# Patient Record
Sex: Female | Born: 2005 | Race: Black or African American | Hispanic: No | Marital: Single | Smoking: Never smoker
Health system: Southern US, Community
[De-identification: ages and names within clinical notes are randomized; demographics above are authoritative.]

## PROBLEM LIST (undated history)

## (undated) DIAGNOSIS — R634 Abnormal weight loss: Secondary | ICD-10-CM

## (undated) DIAGNOSIS — F988 Other specified behavioral and emotional disorders with onset usually occurring in childhood and adolescence: Secondary | ICD-10-CM

## (undated) DIAGNOSIS — Z00129 Encounter for routine child health examination without abnormal findings: Secondary | ICD-10-CM

## (undated) HISTORY — DX: Other specified behavioral and emotional disorders with onset usually occurring in childhood and adolescence: F98.8

## (undated) HISTORY — DX: Abnormal weight loss: R63.4

## (undated) HISTORY — DX: Encounter for routine child health examination without abnormal findings: Z00.129

---
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
## 2023-03-15 ENCOUNTER — Ambulatory Visit (INDEPENDENT_AMBULATORY_CARE_PROVIDER_SITE_OTHER): Payer: BC Managed Care – PPO | Admitting: Family Medicine

## 2023-03-15 VITALS — BP 104/64 | HR 68 | Temp 98.0°F | Resp 16 | Ht 67.0 in | Wt 104.0 lb

## 2023-03-15 DIAGNOSIS — F419 Anxiety disorder, unspecified: Secondary | ICD-10-CM | POA: Diagnosis not present

## 2023-03-15 DIAGNOSIS — J309 Allergic rhinitis, unspecified: Secondary | ICD-10-CM | POA: Diagnosis not present

## 2023-03-15 DIAGNOSIS — R202 Paresthesia of skin: Secondary | ICD-10-CM

## 2023-03-15 DIAGNOSIS — F988 Other specified behavioral and emotional disorders with onset usually occurring in childhood and adolescence: Secondary | ICD-10-CM

## 2023-03-15 DIAGNOSIS — F330 Major depressive disorder, recurrent, mild: Secondary | ICD-10-CM | POA: Diagnosis not present

## 2023-03-15 NOTE — Assessment & Plan Note (Signed)
Flonase prn 

## 2023-03-15 NOTE — Patient Instructions (Signed)
[email or web address]   Insomnia Insomnia is a sleep disorder that makes it difficult to fall asleep or stay asleep. Insomnia can cause fatigue, low energy, difficulty concentrating, mood swings, and poor performance at work or school. There are three different ways to classify insomnia: Difficulty falling asleep. Difficulty staying asleep. Waking up too early in the morning. Any type of insomnia can be long-term (chronic) or short-term (acute). Both are common. Short-term insomnia usually lasts for 3 months or less. Chronic insomnia occurs at least three times a week for longer than 3 months. What are the causes? Insomnia may be caused by another condition, situation, or substance, such as: Having certain mental health conditions, such as anxiety and depression. Using caffeine, alcohol, tobacco, or drugs. Having gastrointestinal conditions, such as gastroesophageal reflux disease (GERD). Having certain medical conditions. These include: Asthma. Alzheimer's disease. Stroke. Chronic pain. An overactive thyroid gland (hyperthyroidism). Other sleep disorders, such as restless legs syndrome and sleep apnea. Menopause. Sometimes, the cause of insomnia may not be known. What increases the risk? Risk factors for insomnia include: Gender. Females are affected more often than males. Age. Insomnia is more common as people get older. Stress and certain medical and mental health conditions. Lack of exercise. Having an irregular work schedule. This may include working night shifts and traveling between different time zones. What are the signs or symptoms? If you have insomnia, the main symptom is having trouble falling asleep or having trouble staying asleep. This may lead to other symptoms, such as: Feeling tired or having low energy. Feeling nervous about going to sleep. Not feeling rested in the morning. Having trouble concentrating. Feeling irritable, anxious, or depressed. How is this  diagnosed? This condition may be diagnosed based on: Your symptoms and medical history. Your health care provider may ask about: Your sleep habits. Any medical conditions you have. Your mental health. A physical exam. How is this treated? Treatment for insomnia depends on the cause. Treatment may focus on treating an underlying condition that is causing the insomnia. Treatment may also include: Medicines to help you sleep. Counseling or therapy. Lifestyle adjustments to help you sleep better. Follow these instructions at home: Eating and drinking  Limit or avoid alcohol, caffeinated beverages, and products that contain nicotine and tobacco, especially close to bedtime. These can disrupt your sleep. Do not eat a large meal or eat spicy foods right before bedtime. This can lead to digestive discomfort that can make it hard for you to sleep. Sleep habits  Keep a sleep diary to help you and your health care provider figure out what could be causing your insomnia. Write down: When you sleep. When you wake up during the night. How well you sleep and how rested you feel the next day. Any side effects of medicines you are taking. What you eat and drink. Make your bedroom a dark, comfortable place where it is easy to fall asleep. Put up shades or blackout curtains to block light from outside. Use a white noise machine to block noise. Keep the temperature cool. Limit screen use before bedtime. This includes: Not watching TV. Not using your smartphone, tablet, or computer. Stick to a routine that includes going to bed and waking up at the same times every day and night. This can help you fall asleep faster. Consider making a quiet activity, such as reading, part of your nighttime routine. Try to avoid taking naps during the day so that you sleep better at night. Get out of bed if you are  still awake after 15 minutes of trying to sleep. Keep the lights down, but try reading or doing a quiet  activity. When you feel sleepy, go back to bed. General instructions Take over-the-counter and prescription medicines only as told by your health care provider. Exercise regularly as told by your health care provider. However, avoid exercising in the hours right before bedtime. Use relaxation techniques to manage stress. Ask your health care provider to suggest some techniques that may work well for you. These may include: Breathing exercises. Routines to release muscle tension. Visualizing peaceful scenes. Make sure that you drive carefully. Do not drive if you feel very sleepy. Keep all follow-up visits. This is important. Contact a health care provider if: You are tired throughout the day. You have trouble in your daily routine due to sleepiness. You continue to have sleep problems, or your sleep problems get worse. Get help right away if: You have thoughts about hurting yourself or someone else. Get help right away if you feel like you may hurt yourself or others, or have thoughts about taking your own life. Go to your nearest emergency room or: Call 911. Call the National Suicide Prevention Lifeline at 864-132-5576 or 988. This is open 24 hours a day. Text the Crisis Text Line at 484-724-7431. Summary Insomnia is a sleep disorder that makes it difficult to fall asleep or stay asleep. Insomnia can be long-term (chronic) or short-term (acute). Treatment for insomnia depends on the cause. Treatment may focus on treating an underlying condition that is causing the insomnia. Keep a sleep diary to help you and your health care provider figure out what could be causing your insomnia. This information is not intended to replace advice given to you by your health care provider. Make sure you discuss any questions you have with your health care provider. Document Revised: 10/03/2021 Document Reviewed: 10/03/2021 Elsevier Patient Education  2023 [employer].

## 2023-03-15 NOTE — Assessment & Plan Note (Addendum)
Has finished school and is not working yet so has not been taking any Adderal. UDS is obtained in case she starts working and needs a new prescription.

## 2023-03-15 NOTE — Progress Notes (Signed)
Subjective:   By signing my name below, I, Vickey Sages, attest that this documentation has been prepared under the direction and in the presence of Bradd Canary, MD., 03/15/2023.   Patient ID: Tiffany Graves, female    DOB: 27-Oct-2006, 17 y.o.   MRN: [id number]  Chief Complaint  Patient presents with   Follow-up    Follow up   HPI Patient is in today for an office visit and is accompanied by her mother.  ADD (attention deficit disorder) Patient recently graduated high school so she has only been taking Adderall 20 mg as needed to manage ADD. This medication has been tolerable and she would like to continue taking it when she starts working. Her controlled substance contract has been renewed today.  Anxiety Patient's mother states that her daughter would benefit from counseling due to her anxiety, sedentary lifestyle, and timid personality. She recently graduated high school and her mother believes counseling could help decide on her next steps in life. She is currently in the process of finding a counselor/therapist with [facility].  Tingling of Upper Lip Patient reports that her top lip has been been tingling for the past several days. She suspects this could be due to coloring pencils she has recently been using. She does not use cosmetics and denies any changes to her diet. She further denies closing of the throat/CP/palpitations/SOB/HA/fever/chills/GI or GU symptoms.  Past Medical History:  Diagnosis Date   ADD (attention deficit disorder) 10/01/2012   Loss of weight 01/25/2016   WCC (well child check) 10/01/2012   No past surgical history on file.  Family History  Problem Relation Age of Onset   Hypertension Mother    Hyperlipidemia Mother    Diabetes Father        type 2   Cancer Paternal Grandmother        breast- remission   Diabetes Paternal Grandmother        type 2   Stroke Paternal Grandfather     Social History   Socioeconomic History    Marital status: Single    Spouse name: Not on file   Number of children: Not on file   Years of education: Not on file   Highest education level: Not on file  Occupational History   Not on file  Tobacco Use   Smoking status: Never   Smokeless tobacco: Never  Substance and Sexual Activity   Alcohol use: No   Drug use: No   Sexual activity: Never  Other Topics Concern   Not on file  Social History Narrative   ** Merged History Encounter **       12 Grade at [employer]   Social Determinants of Health   Financial Resource Strain: Not on file  Food Insecurity: Not on file  Transportation Needs: Not on file  Physical Activity: Not on file  Stress: Not on file  Social Connections: Not on file  Intimate Partner Violence: Not on file    Outpatient Medications Prior to Visit  Medication Sig Dispense Refill   amphetamine-dextroamphetamine (ADDERALL XR) 20 MG 24 hr capsule Take 1 capsule (20 mg total) by mouth daily. November 2023 30 capsule 0   amphetamine-dextroamphetamine (ADDERALL XR) 20 MG 24 hr capsule Take 1 capsule (20 mg total) by mouth every morning. October 2023 30 capsule 0   fluticasone (FLONASE) 50 MCG/ACT nasal spray Place 2 sprays into both nostrils daily. 16 g 6   COVID-19 mRNA bivalent vaccine, Pfizer, (  PFIZER COVID-19 VAC BIVALENT) injection Inject into the muscle. 0.3 mL 0   No facility-administered medications prior to visit.    No Known Allergies  Review of Systems  Constitutional:  Negative for chills and fever.  Respiratory:  Negative for shortness of breath.   Cardiovascular:  Negative for chest pain and palpitations.  Gastrointestinal:  Negative for abdominal pain, blood in stool, constipation, diarrhea, nausea and vomiting.  Genitourinary:  Negative for dysuria, frequency, hematuria and urgency.  Skin:           Neurological:  Negative for headaches.       Objective:    Physical Exam Constitutional:      General: She is not in  acute distress.    Appearance: Normal appearance. She is not ill-appearing.  HENT:     Head: Normocephalic and atraumatic.     Right Ear: External ear normal.     Left Ear: External ear normal.     Nose: Nose normal.     Mouth/Throat:     Mouth: Mucous membranes are moist.     Pharynx: Oropharynx is clear.  Eyes:     General:        Right eye: No discharge.        Left eye: No discharge.     Extraocular Movements: Extraocular movements intact.     Conjunctiva/sclera: Conjunctivae normal.     Pupils: Pupils are equal, round, and reactive to light.  Cardiovascular:     Rate and Rhythm: Normal rate and regular rhythm.     Pulses: Normal pulses.     Heart sounds: Normal heart sounds. No murmur heard.    No gallop.  Pulmonary:     Effort: Pulmonary effort is normal. No respiratory distress.     Breath sounds: Normal breath sounds. No wheezing or rales.  Abdominal:     General: Bowel sounds are normal.     Palpations: Abdomen is soft.     Tenderness: There is no abdominal tenderness. There is no guarding.  Musculoskeletal:        General: Normal range of motion.     Cervical back: Normal range of motion.     Right lower leg: No edema.     Left lower leg: No edema.  Skin:    General: Skin is warm and dry.  Neurological:     Mental Status: She is alert and oriented to person, place, and time.  Psychiatric:        Mood and Affect: Mood normal.        Behavior: Behavior normal.        Judgment: Judgment normal.     BP 104/64 (BP Location: Right Arm, Patient Position: Sitting, Cuff Size: Normal)   Pulse 68   Temp 98 F (36.7 C) (Oral)   Resp 16   Ht 5\' 7"  (1.702 m)   Wt 104 lb (47.2 kg)   SpO2 98%   BMI 16.29 kg/m  Wt Readings from Last 3 Encounters:  03/15/23 104 lb (47.2 kg) (8 %, Z= -1.42)*  10/11/22 102 lb 4 oz (46.4 kg) (6 %, Z= -1.52)*  08/22/22 103 lb 6.4 oz (46.9 kg) (8 %, Z= -1.41)*   * Growth percentiles are based on CDC (Girls, 2-20 Years) data.     Diabetic Foot Exam - Simple   No data filed    Lab Results  Component Value Date   TSH 1.21 01/27/2022    Lab Results  Component Value Date  TSH 1.21 01/27/2022   No results found for: "WBC", "HGB", "HCT", "MCV", "PLT" No results found for: "NA", "K", "CHLORIDE", "CO2", "GLUCOSE", "BUN", "CREATININE", "BILITOT", "ALKPHOS", "AST", "ALT", "PROT", "ALBUMIN", "CALCIUM", "ANIONGAP", "EGFR", "GFR" No results found for: "CHOL" No results found for: "HDL" No results found for: "LDLCALC" No results found for: "TRIG" No results found for: "CHOLHDL" No results found for: "HGBA1C"     Assessment & Plan:  ADD (attention deficit disorder): This is well-controlled with Adderall 20 mg. There were no medication adjustments today and patient's controlled substance contract was renewed today.  Anxiety/Counseling Encouraged patient to follow with counseling to manage anxiety and choose next steps in life. Recommended [email or web address].[email or web address]/. Patient is following with [facility] to find a counselor/therapist.  Healthy Lifestyle: Encouraged 6-8 hours of sleep, heart healthy diet with protein, 60-80 oz of non-alcohol/non-caffeinated fluids, and 4000-8000 steps daily.  Tingling of Upper Lip: Recommended Benadryl, Zyrtec, and allergy testing if tingling persists.  Problem List Items Addressed This Visit     Allergic rhinitis    Flonase prn      ADD (attention deficit disorder) - Primary    Has finished school and is not working yet so has not been taking any Adderal. UDS is obtained in case she starts working and needs a new prescription.       Anxiety    Discussed counseling and they continue to pursue her finding a good fit. Consider medications after that if needed.       Major depressive disorder, recurrent episode, mild (HCC)    Declines medications but is going to pursue counseling      Tingling of face    One episode no other associated symptoms and just upper  lip. No new foods or products. She will let us know if happens again and consider antihistamines if recurs      No orders of the defined types were placed in this encounter.  I, Danise Edge, MD, personally preformed the services described in this documentation.  All medical record entries made by the scribe were at my direction and in my presence.  I have reviewed the chart and discharge instructions (if applicable) and agree that the record reflects my personal performance and is accurate and complete. 03/15/2023  I,Mohammed Iqbal,acting as a scribe for Danise Edge, MD.,have documented all relevant documentation on the behalf of Danise Edge, MD,as directed by  Danise Edge, MD while in the presence of Danise Edge, MD.  Danise Edge, MD

## 2023-03-17 ENCOUNTER — Encounter: Payer: Self-pay | Admitting: Family Medicine

## 2023-03-17 DIAGNOSIS — R202 Paresthesia of skin: Secondary | ICD-10-CM | POA: Insufficient documentation

## 2023-03-17 NOTE — Assessment & Plan Note (Signed)
Discussed counseling and they continue to pursue her finding a good fit. Consider medications after that if needed.

## 2023-03-17 NOTE — Assessment & Plan Note (Signed)
One episode no other associated symptoms and just upper lip. No new foods or products. She will let us know if happens again and consider antihistamines if recurs

## 2023-03-17 NOTE — Assessment & Plan Note (Addendum)
Declines medications but is going to pursue counseling

## 2023-05-21 ENCOUNTER — Ambulatory Visit: Payer: BC Managed Care – PPO | Admitting: Behavioral Health

## 2023-05-21 ENCOUNTER — Encounter: Payer: Self-pay | Admitting: Behavioral Health

## 2023-05-21 DIAGNOSIS — F331 Major depressive disorder, recurrent, moderate: Secondary | ICD-10-CM | POA: Diagnosis not present

## 2023-05-21 DIAGNOSIS — F900 Attention-deficit hyperactivity disorder, predominantly inattentive type: Secondary | ICD-10-CM

## 2023-05-21 DIAGNOSIS — F411 Generalized anxiety disorder: Secondary | ICD-10-CM | POA: Diagnosis not present
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
## 2023-05-21 NOTE — Progress Notes (Signed)
                Craig M Peters, LCMHC 

## 2023-06-05 ENCOUNTER — Encounter: Payer: Self-pay | Admitting: Behavioral Health

## 2023-06-05 ENCOUNTER — Ambulatory Visit (INDEPENDENT_AMBULATORY_CARE_PROVIDER_SITE_OTHER): Payer: BC Managed Care – PPO | Admitting: Behavioral Health

## 2023-06-05 DIAGNOSIS — F411 Generalized anxiety disorder: Secondary | ICD-10-CM | POA: Diagnosis not present

## 2023-06-05 DIAGNOSIS — F330 Major depressive disorder, recurrent, mild: Secondary | ICD-10-CM
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
## 2023-06-05 NOTE — Progress Notes (Signed)
                Craig M Peters, LCMHC 

## 2023-06-19 ENCOUNTER — Ambulatory Visit (INDEPENDENT_AMBULATORY_CARE_PROVIDER_SITE_OTHER): Payer: BC Managed Care – PPO | Admitting: Behavioral Health

## 2023-06-19 ENCOUNTER — Encounter: Payer: Self-pay | Admitting: Behavioral Health

## 2023-06-19 DIAGNOSIS — F411 Generalized anxiety disorder: Secondary | ICD-10-CM | POA: Diagnosis not present

## 2023-06-19 NOTE — Progress Notes (Signed)
[facility] Counselor/Therapist Progress Note  Patient ID: Tiffany Graves, MRN: [id number],    Date: 06/19/2023  Time Spent: 52 minutes, 3:08 PM to 4:00 PM spent in person with the patient.  Treatment Type: Individual Therapy  Reported Symptoms: Anxiety, depression  Mental Status Exam: Appearance:  Well Groomed     Behavior: Appropriate  Motor: Normal  Speech/Language:  Clear and Coherent  Affect: Appropriate  Mood: normal  Thought process: normal  Thought content:   WNL  Sensory/Perceptual disturbances:   WNL  Orientation: oriented to person, place, time/date, situation, day of week, and month of year  Attention: Good  Concentration: Good  Memory: WNL  Fund of knowledge:  Good  Insight:   Good  Judgment:  Good  Impulse Control: Good   Risk Assessment: Danger to Self:  No Self-injurious Behavior: No Danger to Others: No Duty to Warn:no Physical Aggression / Violence:No  Access to Firearms a concern: No  Gang Involvement:No   Subjective: I met with the patient individually  in session today.  She reports there have been no situations for which she has had to use any anxiety coping skills and is thankful for that.  She and her mother are working with a cousin to see what services might be available to help her better understand how to conduct a job [employer] fast stream[employer].  She says she was never taught that in school.  I recommended reaching out to vocational rehabilitation and [employer] so that she could feel better about applying for jobs, interviews etc.  She is not sure how long their insurance will cover therapy so I gave her the main office number to reach out to them to make sure she is covered.  She is fairly sure her father has insurance until November of this year.  We do have more appointments scheduled but she knows she can cancel them if insurance is dropped.  I made her aware that she is on a waiting list for psychoeducational testing but that it could  be several months and they will contact her.  She wanted to be able to get permission for our abdomen staff to speak to her mother so she is going to call the office to get that taken care of.  We briefly introduced a mindfulness exercise in the previous session but I did a mindfulness exercise with her with a song having her be mindful of certain words of that song.  We talked about mindfulness versus intentional mindfulness and how they can benefit anxiety reduction with practice.  She does contract for safety having no thoughts of hurting herself or anyone else.  Interventions: Cognitive Behavioral Therapy  Diagnosis: Generalized anxiety disorder, major depressive disorder, recurrent, mild  Plan: I will meet with the patient every 2 to 3 weeks in person.  Treatment plan: We will use cognitive behavioral therapy as well as elements of dialectical behavior therapy and person centered therapy with a goal of reducing the patient's anxiety and depression and a target date of December 07, 2023.  Goals for anxiety or to improve her ability to better handle stress, identify causes for anxiety and explore ways to reduce it, resolve core conflicts that are contributing to her anxiety and panic as well as to help her manage worrisome thoughts and thinking contributing to anxiety/panic/fear.  Interventions will include providing education about anxiety to help her understand its causes, symptoms and triggers.  We will facilitate problem solution skills to help her implement options for reducing stress,  teach coping skills to reduce anxiety such as grounding exercises, progressive muscle relaxation.  We will use cognitive behavioral therapy to identify and change anxiety producing thoughts and behavior patterns as well as use dialectical behavior therapy to teach mindfulness and distress tolerance skills.  Goals for reducing depression include the patient having less sadness as indicated by PHQ-9 report and  patient report, to help her have an improved mood and return to a healthier level of functioning, identify causes for depressed mood and learn ways to cope with depression.  Interventions include exploring how the patient experiences depression in a day to day setting, using cognitive behavioral therapy to explore and replace unhealthy thoughts and behavior patterns contributing to depression.  We will provide education about depression to help her identify its causes symptoms and triggers, encouraged sharing of feelings related to her depression and its causes and symptoms.  We will also teach and encouraged the consistent use of  coping skills for managing depressive symptoms.  We will also use CBT to identify and change his depression provoking thought and behavior patterns as well as teach distress tolerance skills.  [name], [facility]                  [name], [facility]

## 2023-07-03 ENCOUNTER — Ambulatory Visit (INDEPENDENT_AMBULATORY_CARE_PROVIDER_SITE_OTHER): Payer: BC Managed Care – PPO | Admitting: Behavioral Health

## 2023-07-03 ENCOUNTER — Encounter: Payer: Self-pay | Admitting: Behavioral Health

## 2023-07-03 DIAGNOSIS — F411 Generalized anxiety disorder: Secondary | ICD-10-CM

## 2023-07-03 NOTE — Progress Notes (Addendum)
 [facility] Counselor/Therapist Progress Note  Patient ID: Tiffany Graves, MRN: [id number],    Date: 07/03/2023  Time Spent: 46 minutes, 902 until 9:48 AM spent in person with the patient.  Treatment Type: Individual Therapy  Reported Symptoms: Anxiety, depression  Mental Status Exam: Appearance:  Well Groomed     Behavior: Appropriate  Motor: Normal  Speech/Language:  Clear and Coherent  Affect: Appropriate  Mood: normal  Thought process: normal  Thought content:   WNL  Sensory/Perceptual disturbances:   WNL  Orientation: oriented to person, place, time/date, situation, day of week, and month of year  Attention: Good  Concentration: Good  Memory: WNL  Fund of knowledge:  Good  Insight:   Good  Judgment:  Good  Impulse Control: Good   Risk Assessment: Danger to Self:  No Self-injurious Behavior: No Danger to Others: No Duty to Warn:no Physical Aggression / Violence:No  Access to Firearms a concern: No  Gang Involvement:No   Subjective: The patient's mother has been out of town so she has had to step in and do more things around the house.  She acknowledges not loving doing those things but sees the benefit in knowing how to do those things.  She cooks a little bit more and to make sure that the house was clean when mom came home and was pleased with that.  For the most part her mood has been fairly stable.  She reports a low for anxiety.  Her sleep schedule has been off but she acknowledges that is because she does not have to be anywhere in the morning typically she does not go to sleep until the early hours in the morning and sleeps mostly during the day.  She feels that she can adjust when she does start working or going to school.  She is proud of the fact that she has maintained her weight for over 6 months but still acknowledges she does not eat a lot and not getting hungry that much.  She says she is the hungriest in the middle of the night when she is up  scrolling [employer] videos or playing video games.  She does not want to get up to eat and then.  For example she some Ramen noodles yesterday and that was about it.  We talked about the importance of protein and her primary care doctor address that with her to including protein shakes which she finds to be gritty.  She does like peanut butter.  She will eat cheese.  We talked about eating more vegetables including beans and legumes.  For waporkwe talked about  looking at places like [employer] in the electronics area or being in [place] lobby where she can get an exposure to a variety of things.  She can talk to people but says that it is mostly exhausting to spend a lot of time talking to people so find a job that has a balance of time in front of people versus having some time to work independently and alone would be an ideal fit for her.  The patient has been using some coping skills for anxiety which I encouraged her to continue. She does contract for safety having no thoughts of hurting herself or anyone else.  Interventions: Cognitive Behavioral Therapy  Diagnosis: Generalized anxiety disorder, major depressive disorder, recurrent, mild  Plan: I will meet with the patient every 2 to 3 weeks in person.  Treatment plan: We will use cognitive behavioral therapy as well as elements of dialectical  behavior therapy and person centered therapy with a goal of reducing the patient's anxiety and depression and a target date of December 07, 2023.  Goals for anxiety or to improve her ability to better handle stress, identify causes for anxiety and explore ways to reduce it, resolve core conflicts that are contributing to her anxiety and panic as well as to help her manage worrisome thoughts and thinking contributing to anxiety/panic/fear.  Interventions will include providing education about anxiety to help her understand its causes, symptoms and triggers.  We will facilitate problem solution skills to help her implement  options for reducing stress, teach coping skills to reduce anxiety such as grounding exercises, progressive muscle relaxation.  We will use cognitive behavioral therapy to identify and change anxiety producing thoughts and behavior patterns as well as use dialectical behavior therapy to teach mindfulness and distress tolerance skills.  Goals for reducing depression include the patient having less sadness as indicated by PHQ-9 report and patient report, to help her have an improved mood and return to a healthier level of functioning, identify causes for depressed mood and learn ways to cope with depression.  Interventions include exploring how the patient experiences depression in a day to day setting, using cognitive behavioral therapy to explore and replace unhealthy thoughts and behavior patterns contributing to depression.  We will provide education about depression to help her identify its causes symptoms and triggers, encouraged sharing of feelings related to her depression and its causes and symptoms.  We will also teach and encouraged the consistent use of  coping skills for managing depressive symptoms.  We will also use CBT to identify and change his depression provoking thought and behavior patterns as well as teach distress tolerance skills.  Lorrene CHRISTELLA Hasten, [facility]                  Lorrene CHRISTELLA Hasten, [facility]               Lorrene CHRISTELLA Hasten, [facility]

## 2023-07-17 ENCOUNTER — Ambulatory Visit (INDEPENDENT_AMBULATORY_CARE_PROVIDER_SITE_OTHER): Payer: BC Managed Care – PPO | Admitting: Behavioral Health

## 2023-07-17 ENCOUNTER — Encounter: Payer: Self-pay | Admitting: Behavioral Health

## 2023-07-17 DIAGNOSIS — F411 Generalized anxiety disorder: Secondary | ICD-10-CM

## 2023-07-17 NOTE — Progress Notes (Addendum)
 [facility] Counselor/Therapist Progress Note  Patient ID: Tiffany Graves, MRN: [id number],    Date: 07/17/2023  Time Spent: 51 minutes, 3:05 PM to 3:56 PM spent in person with the patient in the outpatient therapist office.  Treatment Type: Individual Therapy  Reported Symptoms: Anxiety, depression  Mental Status Exam: Appearance:  Well Groomed     Behavior: Appropriate  Motor: Normal  Speech/Language:  Clear and Coherent  Affect: Appropriate  Mood: normal  Thought process: normal  Thought content:   WNL  Sensory/Perceptual disturbances:   WNL  Orientation: oriented to person, place, time/date, situation, day of week, and month of year  Attention: Good  Concentration: Good  Memory: WNL  Fund of knowledge:  Good  Insight:   Good  Judgment:  Good  Impulse Control: Good   Risk Assessment: Danger to Self:  No Self-injurious Behavior: No Danger to Others: No Duty to Warn:no Physical Aggression / Violence:No  Access to Firearms a concern: No  Gang Involvement:No   Subjective: The patient reports mild to moderate anxiety over the past couple of weeks.  She went to a family reunion and for the most part with family she did not know and was very polite but said she did not interact a lot with other people.  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  She and her mom have been helping with her niece and nephew after school because her brother and sister-in-law both started new jobs.  She thinks that will be fairly temporary.  She reports mild to moderate anxiety which is fairly consistent.  We did talk about and reviewed other coping skills but I introduced progressive muscle relaxation encouraging the patient to try that as well as beginning to talk about cognitive reframing or challenging of anxious or negative thoughts.  Also gave her a list of agencies in the area that do psychoeducational testing for she  and her mom to reach out to see about availability.  I told her have her mom reach out to me if she had any further questions about the list.  She does contract for safety having no thoughts of hurting herself or anyone else.  Interventions: Cognitive Behavioral Therapy  Diagnosis: Generalized anxiety disorder, major depressive disorder, recurrent, mild  Plan: I will meet with the patient every 2 to 3 weeks in person.  Treatment plan: We will use cognitive behavioral therapy as well as elements of dialectical behavior therapy and person centered therapy with a goal of reducing the patient's anxiety and depression and a target date of December 07, 2023.  Goals for anxiety or to improve her ability to better handle stress, identify causes for anxiety and explore ways to reduce it, resolve core conflicts that are contributing to her anxiety and panic as well as to help her manage worrisome thoughts and thinking contributing to anxiety/panic/fear.  Interventions will include providing education about anxiety to help her understand its causes, symptoms and triggers.  We will facilitate problem solution skills to help her implement options for reducing stress, teach coping skills to reduce anxiety such as grounding exercises, progressive muscle relaxation.  We will use cognitive behavioral therapy to identify and change anxiety producing thoughts and behavior patterns as well as use dialectical behavior therapy to teach mindfulness and distress tolerance skills.  Goals for reducing depression include the patient having less sadness as indicated by PHQ-9 report and patient report, to help her have an improved mood  and return to a healthier level of functioning, identify causes for depressed mood and learn ways to cope with depression.  Interventions include exploring how the patient experiences depression in a day to day setting, using cognitive behavioral therapy to explore and replace unhealthy thoughts and  behavior patterns contributing to depression.  We will provide education about depression to help her identify its causes symptoms and triggers, encouraged sharing of feelings related to her depression and its causes and symptoms.  We will also teach and encouraged the consistent use of  coping skills for managing depressive symptoms.  We will also use CBT to identify and change his depression provoking thought and behavior patterns as well as teach distress tolerance skills.  Lorrene CHRISTELLA Hasten, [facility]                  Lorrene CHRISTELLA Hasten, [facility]               Lorrene CHRISTELLA Hasten, [facility]               Lorrene CHRISTELLA Hasten, [facility]

## 2023-08-13 ENCOUNTER — Encounter: Payer: Self-pay | Admitting: Behavioral Health

## 2023-08-13 ENCOUNTER — Ambulatory Visit (INDEPENDENT_AMBULATORY_CARE_PROVIDER_SITE_OTHER): Payer: BC Managed Care – PPO | Admitting: Behavioral Health

## 2023-08-13 DIAGNOSIS — F330 Major depressive disorder, recurrent, mild: Secondary | ICD-10-CM

## 2023-08-13 DIAGNOSIS — F411 Generalized anxiety disorder: Secondary | ICD-10-CM

## 2023-08-13 NOTE — Progress Notes (Addendum)
 [facility] Counselor/Therapist Progress Note  Patient ID: Tiffany Graves, MRN: [id number],    Date: 08/13/2023  Time Spent: 54 minutes, 1:04 PM -1:58 PM spent in person with the patient in the outpatient therapist office.  Treatment Type: Individual Therapy  Reported Symptoms: Anxiety, depression  Mental Status Exam: Appearance:  Well Groomed     Behavior: Appropriate  Motor: Normal  Speech/Language:  Clear and Coherent  Affect: Appropriate  Mood: normal  Thought process: normal  Thought content:   WNL  Sensory/Perceptual disturbances:   WNL  Orientation: oriented to person, place, time/date, situation, day of week, and month of year  Attention: Good  Concentration: Good  Memory: WNL  Fund of knowledge:  Good  Insight:   Good  Judgment:  Good  Impulse Control: Good   Risk Assessment: Danger to Self:  No Self-injurious Behavior: No Danger to Others: No Duty to Warn:no Physical Aggression / Violence:No  Access to Firearms a concern: No  Gang Involvement:No   Subjective: The patient processed a conversation with her mother on the way of here about motivation.  She says that because she does not have a job to go to currently she stays up a lot of nights and sleeps until sometime in the afternoon.  Sometimes in the late morning.  Her father did ask her to paint one of their  buildings and her mother to ask her to do some things in the garden.  She has started in the garden but said that she did not understand why which did not have to be anywhere currently she could not do it in the afternoon.  We talked about how to handle that and respectfully assertive way with her parents.  He feels that her parents think her lack of motivation is not finding a job.  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  We talked about expanding her horizons and applying to different places such as stocking materials in stores, childcare etc.  She  recognizes the need to make money.  We talked at length about any anxiety that may be created by her looking for work or going to work and she reports none.  We also talked about ways to let her parents see that she is making progress including asking her mother to look at the job search with her or asking if there are things that she can do around the house She does contract for safety having no thoughts of hurting herself or anyone else.  Interventions: Cognitive Behavioral Therapy  Diagnosis: Generalized anxiety disorder, major depressive disorder, recurrent, mild  Plan: I will meet with the patient every 2 to 3 weeks in person.  Treatment plan: We will use cognitive behavioral therapy as well as elements of dialectical behavior therapy and person centered therapy with a goal of reducing the patient's anxiety and depression and a target date of December 07, 2023.  Goals for anxiety or to improve her ability to better handle stress, identify causes for anxiety and explore ways to reduce it, resolve core conflicts that are contributing to her anxiety and panic as well as to help her manage worrisome thoughts and thinking contributing to anxiety/panic/fear.  Interventions will include providing education about anxiety to help her understand its causes, symptoms and triggers.  We will facilitate problem solution skills to help her implement options for reducing stress, teach coping skills to reduce anxiety such as grounding exercises, progressive muscle relaxation.  We will use cognitive  behavioral therapy to identify and change anxiety producing thoughts and behavior patterns as well as use dialectical behavior therapy to teach mindfulness and distress tolerance skills.  Goals for reducing depression include the patient having less sadness as indicated by PHQ-9 report and patient report, to help her have an improved mood and return to a healthier level of functioning, identify causes for depressed mood and  learn ways to cope with depression.  Interventions include exploring how the patient experiences depression in a day to day setting, using cognitive behavioral therapy to explore and replace unhealthy thoughts and behavior patterns contributing to depression.  We will provide education about depression to help her identify its causes symptoms and triggers, encouraged sharing of feelings related to her depression and its causes and symptoms.  We will also teach and encouraged the consistent use of  coping skills for managing depressive symptoms.  We will also use CBT to identify and change his depression provoking thought and behavior patterns as well as teach distress tolerance skills.  Lorrene CHRISTELLA Hasten, [facility]                  Lorrene CHRISTELLA Hasten, [facility]               Lorrene CHRISTELLA Hasten, [facility]               Lorrene CHRISTELLA Hasten, [facility]               Lorrene CHRISTELLA Hasten, [facility] [place] [facility]

## 2023-08-28 ENCOUNTER — Ambulatory Visit (INDEPENDENT_AMBULATORY_CARE_PROVIDER_SITE_OTHER): Payer: BC Managed Care – PPO | Admitting: Behavioral Health

## 2023-08-28 ENCOUNTER — Encounter: Payer: Self-pay | Admitting: Behavioral Health

## 2023-08-28 DIAGNOSIS — F330 Major depressive disorder, recurrent, mild: Secondary | ICD-10-CM | POA: Diagnosis not present

## 2023-08-28 DIAGNOSIS — F411 Generalized anxiety disorder: Secondary | ICD-10-CM | POA: Diagnosis not present

## 2023-08-28 NOTE — Progress Notes (Signed)
[facility] Counselor/Therapist Progress Note  Patient ID: Tiffany Graves, MRN: [id number],    Date: 08/28/2023  Time Spent: 55 minutes, 2 PM -2 55 PM spent in person with the patient in the outpatient therapist office.  Treatment Type: Individual Therapy  Reported Symptoms: Anxiety, depression  Mental Status Exam: Appearance:  Well Groomed     Behavior: Appropriate  Motor: Normal  Speech/Language:  Clear and Coherent  Affect: Appropriate  Mood: normal  Thought process: normal  Thought content:   WNL  Sensory/Perceptual disturbances:   WNL  Orientation: oriented to person, place, time/date, situation, day of week, and month of year  Attention: Good  Concentration: Good  Memory: WNL  Fund of knowledge:  Good  Insight:   Good  Judgment:  Good  Impulse Control: Good   Risk Assessment: Danger to Self:  No Self-injurious Behavior: No Danger to Others: No Duty to Warn:no Physical Aggression / Violence:No  Access to Firearms a concern: No  Gang Involvement:No   Subjective: The patient for the most part has completed the task at home that her parents have requested her to do.  There is a little bit of the building to paint but her father has to trim some trees first for her to get to it.  There was satisfaction getting those things done.  As we talked about that we started unpacking the patient's wide skill set.  She has a great work Associate Professor.  She is very creative and crafty.  She likes trying new things but says the problem with that as she has a hard time staying with new things for a long therefore she does not look at anything long-term.  We talked about compartmentalization.  He said she broke up with her boyfriend because he was thinking long-term and she does not want to do that.  It also made it difficult for them to be together living in different cities.  I encouraged her to look at her on a wide variety skill set as she looks for things that she can do  vocationally.  She also says she has some control over the leg shaking and has done it on and off since she was a child.  The numbness that comes with the shaking is very comforting to her but for the last 20 minutes of the session she did not have to shake her leg and her anxiety did not increase.  She does contract for safety having no thoughts of hurting herself or anyone else.  Interventions: Cognitive Behavioral Therapy  Diagnosis: Generalized anxiety disorder, major depressive disorder, recurrent, mild  Plan: I will meet with the patient every 2 to 3 weeks in person.  Treatment plan: We will use cognitive behavioral therapy as well as elements of dialectical behavior therapy and person centered therapy with a goal of reducing the patient's anxiety and depression and a target date of December 07, 2023.  Goals for anxiety or to improve her ability to better handle stress, identify causes for anxiety and explore ways to reduce it, resolve core conflicts that are contributing to her anxiety and panic as well as to help her manage worrisome thoughts and thinking contributing to anxiety/panic/fear.  Interventions will include providing education about anxiety to help her understand its causes, symptoms and triggers.  We will facilitate problem solution skills to help her implement options for reducing stress, teach coping skills to reduce anxiety such as grounding exercises, progressive muscle relaxation.  We will use cognitive behavioral therapy to  identify and change anxiety producing thoughts and behavior patterns as well as use dialectical behavior therapy to teach mindfulness and distress tolerance skills.   Goals for reducing depression include the patient having less sadness as indicated by PHQ-9 report and patient report, to help her have an improved mood and return to a healthier level of functioning, identify causes for depressed mood and learn ways to cope with depression.  Interventions  include exploring how the patient experiences depression in a day to day setting, using cognitive behavioral therapy to explore and replace unhealthy thoughts and behavior patterns contributing to depression.  We will provide education about depression to help her identify its causes symptoms and triggers, encouraged sharing of feelings related to her depression and its causes and symptoms.  We will also teach and encouraged the consistent use of  coping skills for managing depressive symptoms.  We will also use CBT to identify and change his depression provoking thought and behavior patterns as well as teach distress tolerance skills.   [name], [facility]                      [name], [facility]                  [name], [facility]               [name], [facility]               [name], [facility]               [name], [facility]               [name], [facility]

## 2023-09-18 ENCOUNTER — Ambulatory Visit: Payer: BC Managed Care – PPO | Admitting: Behavioral Health

## 2023-10-01 ENCOUNTER — Ambulatory Visit: Payer: BC Managed Care – PPO | Admitting: Behavioral Health

## 2023-10-01 ENCOUNTER — Encounter: Payer: Self-pay | Admitting: Behavioral Health

## 2023-10-01 DIAGNOSIS — F4010 Social phobia, unspecified: Secondary | ICD-10-CM

## 2023-10-01 DIAGNOSIS — F330 Major depressive disorder, recurrent, mild: Secondary | ICD-10-CM | POA: Diagnosis not present

## 2023-10-01 DIAGNOSIS — F411 Generalized anxiety disorder: Secondary | ICD-10-CM

## 2023-10-01 NOTE — Progress Notes (Addendum)
 [facility] Counselor/Therapist Progress Note  Patient ID: Tiffany Graves, MRN: [id number],    Date: 10/01/2023  Time Spent: 55 minutes, 3 PM -3 55 PM spent in person with the patient in the outpatient therapist office.  Treatment Type: Individual Therapy  Reported Symptoms: Anxiety, depression  Mental Status Exam: Appearance:  Well Groomed     Behavior: Appropriate  Motor: Normal  Speech/Language:  Clear and Coherent  Affect: Appropriate  Mood: normal  Thought process: normal  Thought content:   WNL  Sensory/Perceptual disturbances:   WNL  Orientation: oriented to person, place, time/date, situation, day of week, and month of year  Attention: Good  Concentration: Good  Memory: WNL  Fund of knowledge:  Good  Insight:   Good  Judgment:  Good  Impulse Control: Good   Risk Assessment: Danger to Self:  No Self-injurious Behavior: No Danger to Others: No Duty to Warn:no Physical Aggression / Violence:No  Access to Firearms a concern: No  Gang Involvement:No   Subjective: I did complete a GAD-7 with the patient in which she scored a 7.  She does acknowledge that there is an undercurrent of anxiety just in general but for the most part it is not overwhelming and there is no panic.  There are still some discomforts in social settings but for the most part she is doing well.  She is using the breathing method but does not report using any coping skills.  We looked at the cognitive aspect of coping.  We talked about the importance use of the coping skills in a consistent way.  She reports that she is actively looking for work but has not found anything yet.  She applied to a daycare but there are forms to fill out when she does not understand.  I encouraged her to ask her mom or her sister to help.  I encouraged her to think outside the box about things that she might like doing that would involve her strengths of creativity.  We talked about some things to give her a  sense of purpose to get up in the morning as she is for the most part staying up all night and sleeping a good part of the day.  She is helping care for nieces and nephews for the time being.  She was not to add to the financial stress we are meeting once a month.  She does contract for safety having no thoughts of hurting herself or anyone else.  Interventions: Cognitive Behavioral Therapy  Diagnosis: Generalized anxiety disorder, major depressive disorder, recurrent, mild  Plan: I will meet with the patient every 2 to 3 weeks in person.  Treatment plan: We will use cognitive behavioral therapy as well as elements of dialectical behavior therapy and person centered therapy with a goal of reducing the patient's anxiety and depression and a target date of December 07, 2023.  Goals for anxiety or to improve her ability to better handle stress, identify causes for anxiety and explore ways to reduce it, resolve core conflicts that are contributing to her anxiety and panic as well as to help her manage worrisome thoughts and thinking contributing to anxiety/panic/fear.  Interventions will include providing education about anxiety to help her understand its causes, symptoms and triggers.  We will facilitate problem solution skills to help her implement options for reducing stress, teach coping skills to reduce anxiety such as grounding exercises, progressive muscle relaxation.  We will use cognitive behavioral therapy to identify and change anxiety producing  thoughts and behavior patterns as well as use dialectical behavior therapy to teach mindfulness and distress tolerance skills.   Goals for reducing depression include the patient having less sadness as indicated by PHQ-9 report and patient report, to help her have an improved mood and return to a healthier level of functioning, identify causes for depressed mood and learn ways to cope with depression.  Interventions include exploring how the patient  experiences depression in a day to day setting, using cognitive behavioral therapy to explore and replace unhealthy thoughts and behavior patterns contributing to depression.  We will provide education about depression to help her identify its causes symptoms and triggers, encouraged sharing of feelings related to her depression and its causes and symptoms.  We will also teach and encouraged the consistent use of  coping skills for managing depressive symptoms.  We will also use CBT to identify and change his depression provoking thought and behavior patterns as well as teach distress tolerance skills.   Lorrene CHRISTELLA Hasten, [facility]                      Lorrene CHRISTELLA Hasten, [facility]                  Lorrene CHRISTELLA Hasten, [facility]               Lorrene CHRISTELLA Hasten, [facility]               Lorrene CHRISTELLA Hasten, [facility]               Lorrene CHRISTELLA Hasten, [facility]               Lorrene CHRISTELLA Hasten, [facility]               Lorrene CHRISTELLA Hasten, [facility]

## 2023-10-07 NOTE — Assessment & Plan Note (Signed)
Patient encouraged to maintain heart healthy diet, regular exercise, adequate sleep. Paps to begin at 19. She continues to struggle with finding a job or school

## 2023-10-09 ENCOUNTER — Ambulatory Visit (INDEPENDENT_AMBULATORY_CARE_PROVIDER_SITE_OTHER): Payer: BC Managed Care – PPO | Admitting: Family Medicine

## 2023-10-09 VITALS — BP 110/68 | HR 77 | Temp 98.3°F | Resp 20 | Ht 67.0 in | Wt 102.2 lb

## 2023-10-09 DIAGNOSIS — Z23 Encounter for immunization: Secondary | ICD-10-CM | POA: Diagnosis not present

## 2023-10-09 DIAGNOSIS — Z0000 Encounter for general adult medical examination without abnormal findings: Secondary | ICD-10-CM

## 2023-10-09 DIAGNOSIS — F909 Attention-deficit hyperactivity disorder, unspecified type: Secondary | ICD-10-CM | POA: Diagnosis not present

## 2023-10-09 DIAGNOSIS — F330 Major depressive disorder, recurrent, mild: Secondary | ICD-10-CM

## 2023-10-09 NOTE — Progress Notes (Unsigned)
Subjective:    Patient ID: Tiffany Graves, female    DOB: Oct 02, 2006, 17 y.o.   MRN: [id number]  Chief Complaint  Patient presents with   Annual Exam    HPI Discussed the use of AI scribe software for clinical note transcription with the patient, who gave verbal consent to proceed.  History of Present Illness   The patient, a young adult with a history of anxiety, depression, and ADHD, presents with ongoing struggles with these conditions. The patient's mother reports that the patient has difficulty engaging with the world and lacks motivation. The patient also has poor eating habits and a low body mass index, which the doctor emphasizes needs to be addressed for overall health. The patient reports a cough, which she believes may be due to allergies. The patient is resistant to the idea of medication for her mental health issues, but is open to therapy.        Past Medical History:  Diagnosis Date   ADD (attention deficit disorder) 10/01/2012   Loss of weight 01/25/2016   WCC (well child check) 10/01/2012    No past surgical history on file.  Family History  Problem Relation Age of Onset   Hypertension Mother    Hyperlipidemia Mother    Diabetes Father        type 2   Cancer Paternal Grandmother        breast- remission   Diabetes Paternal Grandmother        type 2   Stroke Paternal Grandfather     Social History   Socioeconomic History   Marital status: Single    Spouse name: Not on file   Number of children: Not on file   Years of education: Not on file   Highest education level: Not on file  Occupational History   Not on file  Tobacco Use   Smoking status: Never   Smokeless tobacco: Never  Substance and Sexual Activity   Alcohol use: No   Drug use: No   Sexual activity: Never  Other Topics Concern   Not on file  Social History Narrative   ** Merged History Encounter **       12 Grade at [employer]   Social Determinants of Health   Financial  Resource Strain: Not on file  Food Insecurity: Not on file  Transportation Needs: Not on file  Physical Activity: Not on file  Stress: Not on file  Social Connections: Not on file  Intimate Partner Violence: Not on file    Outpatient Medications Prior to Visit  Medication Sig Dispense Refill   amphetamine-dextroamphetamine (ADDERALL XR) 20 MG 24 hr capsule Take 1 capsule (20 mg total) by mouth daily. November 2023 30 capsule 0   amphetamine-dextroamphetamine (ADDERALL XR) 20 MG 24 hr capsule Take 1 capsule (20 mg total) by mouth every morning. October 2023 30 capsule 0   fluticasone (FLONASE) 50 MCG/ACT nasal spray Place 2 sprays into both nostrils daily. 16 g 6   No facility-administered medications prior to visit.    No Known Allergies  Review of Systems  Constitutional:  Negative for chills, fever and malaise/fatigue.  HENT:  Negative for congestion and hearing loss.   Eyes:  Negative for discharge.  Respiratory:  Negative for cough, sputum production and shortness of breath.   Cardiovascular:  Negative for chest pain, palpitations and leg swelling.  Gastrointestinal:  Negative for abdominal pain, blood in stool, constipation, diarrhea, heartburn, nausea and vomiting.  Genitourinary:  Negative  for dysuria, frequency, hematuria and urgency.  Musculoskeletal:  Negative for back pain, falls and myalgias.  Skin:  Negative for rash.  Neurological:  Negative for dizziness, sensory change, loss of consciousness, weakness and headaches.  Endo/Heme/Allergies:  Negative for environmental allergies. Does not bruise/bleed easily.  Psychiatric/Behavioral:  Negative for depression and suicidal ideas. The patient is nervous/anxious. The patient does not have insomnia.        Objective:    Physical Exam Constitutional:      General: She is not in acute distress.    Appearance: Normal appearance. She is not diaphoretic.  HENT:     Head: Normocephalic and atraumatic.     Right Ear:  Tympanic membrane, ear canal and external ear normal.     Left Ear: Tympanic membrane, ear canal and external ear normal.     Nose: Nose normal.     Mouth/Throat:     Mouth: Mucous membranes are moist.     Pharynx: Oropharynx is clear. No oropharyngeal exudate.  Eyes:     General: No scleral icterus.       Right eye: No discharge.        Left eye: No discharge.     Conjunctiva/sclera: Conjunctivae normal.     Pupils: Pupils are equal, round, and reactive to light.  Neck:     Thyroid: No thyromegaly.  Cardiovascular:     Rate and Rhythm: Normal rate and regular rhythm.     Heart sounds: Normal heart sounds. No murmur heard. Pulmonary:     Effort: Pulmonary effort is normal. No respiratory distress.     Breath sounds: Normal breath sounds. No wheezing or rales.  Abdominal:     General: Bowel sounds are normal. There is no distension.     Palpations: Abdomen is soft. There is no mass.     Tenderness: There is no abdominal tenderness.  Musculoskeletal:        General: No tenderness. Normal range of motion.     Cervical back: Normal range of motion and neck supple.  Lymphadenopathy:     Cervical: No cervical adenopathy.  Skin:    General: Skin is warm and dry.     Findings: No rash.  Neurological:     General: No focal deficit present.     Mental Status: She is alert and oriented to person, place, and time.     Cranial Nerves: No cranial nerve deficit.     Coordination: Coordination normal.     Deep Tendon Reflexes: Reflexes are normal and symmetric. Reflexes normal.  Psychiatric:        Mood and Affect: Mood normal.        Behavior: Behavior normal.        Thought Content: Thought content normal.        Judgment: Judgment normal.     BP 110/68 (BP Location: Right Arm, Patient Position: Sitting, Cuff Size: Normal)   Pulse 77   Temp 98.3 F (36.8 C) (Oral)   Resp 20   Ht 5\' 7"  (1.702 m)   Wt 102 lb 3.2 oz (46.4 kg)   LMP  (LMP Unknown)   SpO2 99%   BMI 16.01 kg/m   Wt Readings from Last 3 Encounters:  10/09/23 102 lb 3.2 oz (46.4 kg) (5%, Z= -1.62)*  03/15/23 104 lb (47.2 kg) (8%, Z= -1.42)*  10/11/22 102 lb 4 oz (46.4 kg) (6%, Z= -1.52)*   * Growth percentiles are based on CDC (Girls, 2-20 Years) data.  Diabetic Foot Exam - Simple   No data filed    Lab Results  Component Value Date   TSH 1.21 01/27/2022    Lab Results  Component Value Date   TSH 1.21 01/27/2022   No results found for: "WBC", "HGB", "HCT", "MCV", "PLT" No results found for: "NA", "K", "CHLORIDE", "CO2", "GLUCOSE", "BUN", "CREATININE", "BILITOT", "ALKPHOS", "AST", "ALT", "PROT", "ALBUMIN", "CALCIUM", "ANIONGAP", "EGFR", "GFR" No results found for: "CHOL" No results found for: "HDL" No results found for: "LDLCALC" No results found for: "TRIG" No results found for: "CHOLHDL" No results found for: "HGBA1C"     Assessment & Plan:  Preventative health care Assessment & Plan: Patient encouraged to maintain heart healthy diet, regular exercise, adequate sleep. Paps to begin at 19. She continues to struggle with finding a job or school    Attention deficit hyperactivity disorder (ADHD), unspecified ADHD type Assessment & Plan: Not using meds presently  Orders: -     Ambulatory referral to Behavioral Health  Need for influenza vaccination -     Flu vaccine trivalent PF, 6mos and older(Flulaval,Afluria,Fluarix,Fluzone)  Major depressive disorder, recurrent episode, mild (HCC) Assessment & Plan: She continues to spend most of her time at home and is not working or going to school. She reports she is looking for a job. She has started with a counselor and finds that somewhat helpful but her mother who accompanies.her does not notice it has changed her activity levels. She is offered medications and declines for now but she is interested in getting reevaluated for ADD and the possibility of Autism Spectrum Disorder. She is [facility] to [facility] for further  evaluation     Assessment and Plan    Anxiety and Depression Persistent symptoms of anxiety and possible depression. Currently in counseling. Discussed the potential benefits of medication management with SSRIs, but patient is hesitant. -Consider starting an SSRI for anxiety and depression. -Continue counseling. -Consider referral to a psychiatrist if patient decides to start medication. -Follow-up in 3-4 months or sooner if patient decides to start medication.  Nutrition and Weight Low BMI (16) with poor dietary habits. Discussed the importance of regular protein intake and a balanced diet for overall health and development. -Increase protein intake, aiming for every 3-4 hours. -Consider incorporating more whole foods into diet, such as nuts, beans, seeds, dairy, fruits, and vegetables. -Consider consultation with a nutritionist.  ADHD and Possible Autism Currently undergoing evaluation for ADHD. Mother has requested autism testing. -Continue with ADHD evaluation. -Start referral process for autism testing.  Cough Persistent cough, possibly due to allergies. -Trial of over-the-counter Flonase and Zyrtec for 2 weeks. -If symptoms worsen (e.g., shortness of breath, green mucus, fever), consider further evaluation.  General Health Maintenance -Continue with annual flu vaccination. -Consider job search and engagement in the community for overall mental health improvement. -Follow-up in early 2025.         Danise Edge, MD

## 2023-10-09 NOTE — Patient Instructions (Signed)
Preventive Care 18-19 Years Old, Female Preventive care refers to lifestyle choices and visits with your health care provider that can promote health and wellness. At this stage in your life, you may start seeing a primary care physician instead of a pediatrician for your preventive care. Preventive care visits are also called wellness exams. What can I expect for my preventive care visit? Counseling During your preventive care visit, your health care provider may ask about your: Medical history, including: Past medical problems. Family medical history. Pregnancy history. Current health, including: Menstrual cycle. Method of birth control. Emotional well-being. Home life and relationship well-being. Sexual activity and sexual health. Lifestyle, including: Alcohol, nicotine or tobacco, and drug use. Access to firearms. Diet, exercise, and sleep habits. Sunscreen use. Motor vehicle safety. Physical exam Your health care provider may check your: Height and weight. These may be used to calculate your BMI (body mass index). BMI is a measurement that tells if you are at a healthy weight. Waist circumference. This measures the distance around your waistline. This measurement also tells if you are at a healthy weight and may help predict your risk of certain diseases, such as type 2 diabetes and high blood pressure. Heart rate and blood pressure. Body temperature. Skin for abnormal spots. Breasts. What immunizations do I need?  Vaccines are usually given at various ages, according to a schedule. Your health care provider will recommend vaccines for you based on your age, medical history, and lifestyle or other factors, such as travel or where you work. What tests do I need? Screening Your health care provider may recommend screening tests for certain conditions. This may include: Vision and hearing tests. Lipid and cholesterol levels. Pelvic exam and Pap test. Hepatitis B  test. Hepatitis C test. HIV (human immunodeficiency virus) test. STI (sexually transmitted infection) testing, if you are at risk. Tuberculosis skin test if you have symptoms. BRCA-related cancer screening. This may be done if you have a family history of breast, ovarian, tubal, or peritoneal cancers. Talk with your health care provider about your test results, treatment options, and if necessary, the need for more tests. Follow these instructions at home: Eating and drinking  Eat a healthy diet that includes fresh fruits and vegetables, whole grains, lean protein, and low-fat dairy products. Drink enough fluid to keep your urine pale yellow. Do not drink alcohol if: Your health care provider tells you not to drink. You are pregnant, may be pregnant, or are planning to become pregnant. You are under the legal drinking age. In the [place] the legal drinking age is 21. If you drink alcohol: Limit how much you have to 0-1 drink a day. Know how much alcohol is in your drink. In the [place] one drink equals one 12 oz bottle of beer (355 mL), one 5 oz glass of wine (148 mL), or one 1 oz glass of hard liquor (44 mL). Lifestyle Brush your teeth every morning and night with fluoride toothpaste. Floss one time each day. Exercise for at least 30 minutes 5 or more days of the week. Do not use any products that contain nicotine or tobacco. These products include cigarettes, chewing tobacco, and vaping devices, such as e-cigarettes. If you need help quitting, ask your health care provider. Do not use drugs. If you are sexually active, practice safe sex. Use a condom or other form of protection to prevent STIs. If you do not wish to become pregnant, use a form of birth control. If you plan to become pregnant,   see your health care provider for a prepregnancy visit. Find healthy ways to manage stress, such as: Meditation, yoga, or listening to music. Journaling. Talking to a trusted person. Spending time  with friends and family. Safety Always wear your seat belt while driving or riding in a vehicle. Do not drive: If you have been drinking alcohol. Do not ride with someone who has been drinking. When you are tired or distracted. While texting. If you have been using any mind-altering substances or drugs. Wear a helmet and other protective equipment during sports activities. If you have firearms in your house, make sure you follow all gun safety procedures. Seek help if you have been bullied, physically abused, or sexually abused. Use the internet responsibly to avoid dangers, such as online bullying and online sex predators. What's next? Go to your health care provider once a year for an annual wellness visit. Ask your health care provider how often you should have your eyes and teeth checked. Stay up to date on all vaccines. This information is not intended to replace advice given to you by your health care provider. Make sure you discuss any questions you have with your health care provider. Document Revised: 04/20/2021 Document Reviewed: 04/20/2021 Elsevier Patient Education  2024 Elsevier Inc.  

## 2023-10-10 ENCOUNTER — Encounter: Payer: Self-pay | Admitting: Family Medicine

## 2023-10-10 NOTE — Assessment & Plan Note (Signed)
Not using meds presently

## 2023-10-10 NOTE — Assessment & Plan Note (Signed)
She continues to spend most of her time at home and is not working or going to school. She reports she is looking for a job. She has started with a counselor and finds that somewhat helpful but her mother who accompanies.her does not notice it has changed her activity levels. She is offered medications and declines for now but she is interested in getting reevaluated for ADD and the possibility of Autism Spectrum Disorder. She is referred to [facility] for further evaluation

## 2023-10-15 ENCOUNTER — Ambulatory Visit: Payer: BC Managed Care – PPO | Admitting: Behavioral Health

## 2023-10-29 ENCOUNTER — Ambulatory Visit: Payer: BC Managed Care – PPO | Admitting: Behavioral Health

## 2023-12-12 ENCOUNTER — Encounter: Payer: Self-pay | Admitting: Psychology

## 2023-12-12 ENCOUNTER — Ambulatory Visit: Payer: BC Managed Care – PPO | Admitting: Psychology

## 2023-12-12 DIAGNOSIS — F840 Autistic disorder: Secondary | ICD-10-CM

## 2023-12-12 DIAGNOSIS — F902 Attention-deficit hyperactivity disorder, combined type: Secondary | ICD-10-CM | POA: Diagnosis not present

## 2023-12-12 NOTE — Progress Notes (Signed)
 [facility] Counselor Initial Adult Exam  Name: Tiffany Graves Date: 12/12/2023 MRN: [id number] DOB: [birth year] PCP: Domenica Harlene LABOR, MD  Time spent: 4:00 - 4:50 pm  Guardian/Informant:   [name] - patient    Paperwork requested: No  Met with patient for initial interview.  Patient was at home and session was conducted from therapist's office via video conferencing.  Patient expressed awareness of the limitations related to video sessions and verbally consented to telehealth.    Reason for Visit /Presenting Problem: Patient was previously evaluated by this provider in 2017.  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   Mental Status Exam: Appearance:   Neat and Well Groomed     Behavior:  Appropriate and Sharing  Motor:  Restlestness  Speech/Language:   Clear and Coherent and Normal Rate  Affect:  Appropriate and Full Range  Mood:  euthymic  Thought process:  Circular  Thought content:    WNL  Sensory/Perceptual disturbances:    WNL  Orientation:  oriented to person, place, time/date, and situation  Attention:  Good  Concentration:  Good  Memory:  WNL  Fund of knowledge:   Good  Insight:    Good  Judgment:   Good  Impulse Control:  Good   Developmental History: Early delays -  Motor - Gross motor skills are adequate but is not fluid with coordinated movement including while driving.  Trouble with motor planning and has to do movements incrementally.  Has fine motor difficulty including trouble with handwriting.  Struggled with buttons until 7th grade.  Adequate with other fine motor but hand shakes while driving.    Speech - Patient talks in circles and will sometimes stammer or stutter when anxious.  Can explain self well when calming according to teachers.   Self Care - Poor at making sure she eats.  Other self care good other than remembering to brush  teeth or wash face. Has trouble styling her hair, can only wash and condition it.   Independent - Struggles being independent.  Asks mother for help with most areas.  Prefers when navigational issues are explained to her.   Social - Has strong long term friendships, but doesn't take initiative to contact friends.    Reported Symptoms:  Trouble falling asleep easily.  Been going to sleep at 6 am or sunrise due to staying on phone with someone working the night shift.  Inconsistent with sleep even when not talking to them.  No recent changes in appetite.  Energy inconsistent during the day (poor sleep and forgetting to eat).  No prolonged sadness or depressed mood.  Becomes anxious during holiday season.  No anxiety or panic within the last few months.  No specific worries or fears.  General worry.  Anxious during public speaking or doing activities alone.   Will hang out in groups but not do karaoke.  Occasional intrusive thought.  No compulsive behavior other than excessive checking.  Trouble paying attention.  Easily distracted.  Frequent losing and forgetting.  Poor organization.  Restless/fidgety, Verbally impulsive, some impulsive behavior.  Gets along adequately with peers but doesn't like too many of them. Feels like understands jokes and sarcasm but not sure.  Can read nonverbal cues most times but does not know how to respond to them.  Has some close long term friendships but doesn't initiate contact with them.  Out of sight out of mind.  No repetitive speech or behavior.  Some overly intense interests.  Can do adequately with change and transition as long as the change is logical to her.  No sensory sensitivity per patient, but becomes anxious/bothered by hearing paper crinkling.             Risk Assessment: Danger to Self:  No Self-injurious Behavior: No Danger to Others: No Duty to Warn:no Physical Aggression / Violence:No  Access to Firearms a concern: No  Gang Involvement:No  Patient /  guardian was educated about steps to take if suicide or homicide risk level increases between visits: n/a While future psychiatric events cannot be accurately predicted, the patient does not currently require acute inpatient psychiatric care and does not currently meet [place]  involuntary commitment criteria.  Substance Abuse History: Current substance abuse: No     Past Psychiatric History:   Previous psychological history is significant for ADHD, anxiety, and autism (suspected) Outpatient Providers:Doesn't have a specific therapist.   History of Psych Hospitalization: No  Psychological Testing: Attention/ADHD:  and Specific LD by this provider in 2017, along with ASD.      Abuse History:  Victim of: No.,  None    Report needed: No. Victim of Neglect:No. Perpetrator of  None   Witness / Exposure to Domestic Violence: No   Protective Services Involvement: No  Witness to [employer] Violence:  No   Family History:  Family History  Problem Relation Age of Onset   Hypertension Mother    Hyperlipidemia Mother    Diabetes Father        type 2   Cancer Paternal Grandmother        breast- remission   Diabetes Paternal Grandmother        type 2   Stroke Paternal Grandfather   Anxiety Mother and Father's side (may be more but no one gets tested).     Living situation: the patient lives with their family (parents and self).  Good relations with mother.  Adequate relations with father but he argues with her more.     Sexual Orientation: Bisexual female (mostly she/her).     Relationship Status: single  Name of spouse / other:None currently  If a parent, number of children / ages:None  Support Systems: closest to mother but friends seem more supportive.   Financial Stress:  Yes   Income/Employment/Disability: Supported by Parents (only father is working at this time).  No previous job.    Military Service: No   Educational History: Education: high school diploma/GED No  college   Any cultural differences that may affect / interfere with treatment:  Black/AA  Recreation/Hobbies: Crocheting, making soap, painting, audio books, You Tube.    Stressors: Financial difficulties   Marital or family conflict   Nice and nephew living with family (niece has severe autism).    Strengths: Drawing, describes scenery in her head.    Barriers:  Doesn't know how to do any adult navigational activities (overly dependent on others for transportation, filling out applications, creating a resume.     Legal History: Pending legal issue / charges: The patient has no significant history of legal issues. History of legal issue / charges:  None  Medical History/Surgical History: reviewed Past Medical History:  Diagnosis Date   ADD (attention deficit disorder) 10/01/2012   Loss of weight 01/25/2016   WCC (well child check) 10/01/2012  Underweight but not at risk.    No past surgical history on file. Hospitalized for bringing a kitchen knife  to school during 2017.    Medications: Current Outpatient Medications  Medication Sig Dispense Refill   amphetamine -dextroamphetamine  (ADDERALL XR) 20 MG 24 hr capsule Take 1 capsule (20 mg total) by mouth daily. November 2023 30 capsule 0   amphetamine -dextroamphetamine  (ADDERALL XR) 20 MG 24 hr capsule Take 1 capsule (20 mg total) by mouth every morning. October 2023 30 capsule 0   fluticasone  (FLONASE ) 50 MCG/ACT nasal spray Place 2 sprays into both nostrils daily. 16 g 6   No current facility-administered medications for this visit.  Currently prescribed Adderall but not taking as not in school or working.  No Known Allergies Allergy to mold no digestion problems.  No concussions seizures or HI   Diagnoses:  Autism spectrum disorder  Attention deficit hyperactivity disorder (ADHD), combined type  Plan of Care: Patients presents with a history of anxiety and depressed mood, along with impairment in executive function and  navigational skills.  This keeps her from obtaining her driver's license, attending college or finding a job.  She was previously evaluated in 2017 by this evaluator and diagnosed with Autism spectrum disorder and ADHD at that time, but updated testing is needed for patient to access adult support services such as job and independent skills training.  Testing recommended to update neurocognitive and social emotional functioning.  Test Battery K-BIT 2R or WAIS 5, CNSVS, WJ-IV (reading and writing), BRIEF-2A, CAARS-2, Adult OCD, DASS, ADOS 2 Module 4, SRS-2 (S & O).        Trameka Dorough, PhD

## 2023-12-12 NOTE — Progress Notes (Signed)
   Tiffany Dames, PhD

## 2023-12-19 ENCOUNTER — Encounter: Payer: Self-pay | Admitting: Psychology

## 2023-12-19 ENCOUNTER — Ambulatory Visit: Payer: BC Managed Care – PPO | Admitting: Psychology

## 2023-12-19 DIAGNOSIS — F840 Autistic disorder: Secondary | ICD-10-CM

## 2023-12-19 DIAGNOSIS — F902 Attention-deficit hyperactivity disorder, combined type: Secondary | ICD-10-CM

## 2023-12-19 DIAGNOSIS — F411 Generalized anxiety disorder: Secondary | ICD-10-CM

## 2023-12-19 NOTE — Progress Notes (Signed)
Tiffany Dames, PhD

## 2023-12-19 NOTE — Progress Notes (Signed)
[facility] Counselor/Therapist Progress Note  Patient ID: Tiffany Graves, MRN: [id number],    Date: 12/19/2023  Time Spent: 8:30 - 11:30 am   Treatment Type: Testing  Met with patient for testing session.  Patient was at the clinic and session was conducted from therapist's office in person.    Reported Symptoms/Reason for Referral: Patients presents with a history of anxiety and depressed mood, along with impairment in executive function and navigational skills. This keeps her from obtaining her driver's license, attending college or finding a job. She was previously evaluated in 2017 by this evaluator and diagnosed with Autism spectrum disorder and ADHD at that time, but updated testing is needed for patient to access adult support services such as job and independent skills training. Testing recommended to update neurocognitive and social emotional functioning.   Mental Status Exam: Appearance:  Casual and Fairly Groomed     Behavior: Appropriate  Motor: Normal  Speech/Language:  Clear and Coherent and Normal Rate  Affect: Full, appropriate  Mood: euthymic  Thought process: normal  Thought content:   WNL  Sensory/Perceptual disturbances:   WNL  Orientation: oriented to person, place, time/date, and situation  Attention: Good  Concentration: Fair  Memory: WNL  Fund of knowledge:  Good  Insight:   Good  Judgment:  Good  Impulse Control: Good   Risk Assessment: Danger to Self:  No Self-injurious Behavior: No Danger to Others: No Duty to Warn:no Physical Aggression / Violence:No   Subjective: Testing included the K-BIT 2R (1 hr. for testing and scoring) along with the CNS Vital signs (1 hrs) and [employer] - IV (1 hr.).  Parents were sent the [place] 3 and SRS-2 to complete online prior to next session.    Patient was cooperative and displayed good effort. Attention and concentration were adequate overall, although patient exhibited several instances of  self-correction, and asking questions to be repeated.  Patient needed an extensive break and worked slowly in general which led to longer than expected testing time.  Mood was euthymic with appropriate affect.  The results appear representative of current functioning.    Diagnosis:Autism spectrum disorder  Attention deficit hyperactivity disorder (ADHD), combined type  Generalized anxiety disorder  Plan: Testing to continue next session with the ADOS 2 Module 4, BRIEF 2A, and CAARS-2 followed by report writing and interactive feedback.     Bryson Dames, PhD

## 2023-12-24 ENCOUNTER — Ambulatory Visit: Payer: BC Managed Care – PPO | Admitting: Behavioral Health

## 2023-12-24 ENCOUNTER — Encounter: Payer: Self-pay | Admitting: Behavioral Health

## 2023-12-24 DIAGNOSIS — F330 Major depressive disorder, recurrent, mild: Secondary | ICD-10-CM | POA: Diagnosis not present

## 2023-12-24 DIAGNOSIS — F411 Generalized anxiety disorder: Secondary | ICD-10-CM

## 2023-12-24 DIAGNOSIS — F840 Autistic disorder: Secondary | ICD-10-CM

## 2023-12-24 NOTE — Progress Notes (Addendum)
 [facility] Counselor/Therapist Progress Note  Patient ID: Tiffany Graves, MRN: [id number],    Date: 12/24/2023  Time Spent: 5 6 minutes, 9:04 AM until 10 AM spent in person with the patient in the outpatient therapist office.  Treatment Type: Individual Therapy  Reported Symptoms: Anxiety, depression  Mental Status Exam: Appearance:  Well Groomed     Behavior: Appropriate  Motor: Normal  Speech/Language:  Clear and Coherent  Affect: Appropriate  Mood: normal  Thought process: normal  Thought content:   WNL  Sensory/Perceptual disturbances:   WNL  Orientation: oriented to person, place, time/date, situation, day of week, and month of year  Attention: Good  Concentration: Good  Memory: WNL  Fund of knowledge:  Good  Insight:   Good  Judgment:  Good  Impulse Control: Good   Risk Assessment: Danger to Self:  No Self-injurious Behavior: No Danger to Others: No Duty to Warn:no Physical Aggression / Violence:No  Access to Firearms a concern: No  Gang Involvement:No   Subjective: The patient is currently undergoing psychoeducational testing having completed the initial interview in 1 session.  The second session is tomorrow with follow-up 2 weeks after that.  She had this done the same psychologist about 7 years ago.  Notes indicate that it is being done to see what services the patient qualifies for.  I had not seen the patient in November because of some insurance and financial reasons.  He reports for the most part that has not made any progress with trying to find work.  She is doing some things around the house.  She acknowledges some difficulty with spelling and writing words down.  She did discuss the possibility of dysgraphia with her psychologist.  We looked more at sensory issues.  She indicates that she does not like anything that is to be more feels funny in her mouth including more fat from meat or anything as fibers and like broccoli.  She does not like  to wash dishes because she does not like things in the water touching her hands or even at times seeing things on the plate before she  washes it.  There also some olfactory sensory issues.  Not just that some of his sensory issues drive her anxiety including some of her social anxiety.  Continue to work on the cognitive aspect of challenging some of those thoughts related to sensory issues that she knows are not legitimate but feels very real to her.  We also worked on mindfulness to be able to help keep her thoughts in the moment so she does not necessarily project. She was not to add to the financial stress we are meeting once a month.  She does contract for safety having no thoughts of hurting herself or anyone else.  Interventions: Cognitive Behavioral Therapy  Diagnosis: Generalized anxiety disorder, major depressive disorder, recurrent, mild  Plan: I will meet with the patient every 2 to 3 weeks in person.  Treatment plan: We will use cognitive behavioral therapy as well as elements of dialectical behavior therapy and person centered therapy with a goal of reducing the patient's anxiety and depression and a target date of December 07, 2023.  Goals for anxiety or to improve her ability to better handle stress, identify causes for anxiety and explore ways to reduce it, resolve core conflicts that are contributing to her anxiety and panic as well as to help her manage worrisome thoughts and thinking contributing to anxiety/panic/fear.  Interventions will include providing education about anxiety  to help her understand its causes, symptoms and triggers.  We will facilitate problem solution skills to help her implement options for reducing stress, teach coping skills to reduce anxiety such as grounding exercises, progressive muscle relaxation.  We will use cognitive behavioral therapy to identify and change anxiety producing thoughts and behavior patterns as well as use dialectical behavior therapy to  teach mindfulness and distress tolerance skills.   Goals for reducing depression include the patient having less sadness as indicated by PHQ-9 report and patient report, to help her have an improved mood and return to a healthier level of functioning, identify causes for depressed mood and learn ways to cope with depression.  Interventions include exploring how the patient experiences depression in a day to day setting, using cognitive behavioral therapy to explore and replace unhealthy thoughts and behavior patterns contributing to depression.  We will provide education about depression to help her identify its causes symptoms and triggers, encouraged sharing of feelings related to her depression and its causes and symptoms.  We will also teach and encouraged the consistent use of  coping skills for managing depressive symptoms.  We will also use CBT to identify and change his depression provoking thought and behavior patterns as well as teach distress tolerance skills. Progress: 30% we reviewed the treatment goals with the patient and we will continue the goals as stated above with a new target date of June 05, 2024 [name], [facility]                      [name], [facility]                  [name], [facility]               [name], [facility]               [name], [facility]               [name], [facility]               [name], [facility]               [name], [facility]

## 2023-12-25 ENCOUNTER — Encounter: Payer: Self-pay | Admitting: Psychology

## 2023-12-25 ENCOUNTER — Ambulatory Visit: Payer: BC Managed Care – PPO | Admitting: Psychology

## 2023-12-25 DIAGNOSIS — F902 Attention-deficit hyperactivity disorder, combined type: Secondary | ICD-10-CM | POA: Diagnosis not present

## 2023-12-25 DIAGNOSIS — F840 Autistic disorder: Secondary | ICD-10-CM | POA: Diagnosis not present

## 2023-12-25 DIAGNOSIS — F411 Generalized anxiety disorder: Secondary | ICD-10-CM | POA: Diagnosis not present

## 2023-12-25 NOTE — Progress Notes (Signed)
[facility] Counselor/Therapist Progress Note  Patient ID: Tiffany Graves, MRN: [id number],    Date: 12/25/2023  Time Spent: 8:30 - 11:15 am   Treatment Type: Testing  Met with patient for testing session.  Patient was at the clinic and session was conducted from therapist's office in person.    Reported Symptoms/Reason for Referral: Patients presents with a history of anxiety and depressed mood, along with impairment in executive function and navigational skills. This keeps her from obtaining her driver's license, attending college or finding a job. She was previously evaluated in 2017 by this evaluator and diagnosed with Autism spectrum disorder and ADHD at that time, but updated testing is needed for patient to access adult support services such as job and independent skills training. Testing recommended to update neurocognitive and social emotional functioning.   Mental Status Exam: Appearance:  Casual and Neatly Groomed     Behavior: Appropriate  Motor: Normal  Speech/Language:  Clear and Coherent and Normal Rate  Affect: Full, appropriate  Mood: euthymic  Thought process: normal  Thought content:   WNL  Sensory/Perceptual disturbances:   WNL  Orientation: oriented to person, place, time/date, and situation  Attention: Good  Concentration: Fair  Memory: WNL  Fund of knowledge:  Good  Insight:   Fair  Judgment:  Good  Impulse Control: Good   Risk Assessment: Danger to Self:  No Self-injurious Behavior: No Danger to Others: No Duty to Warn:no Physical Aggression / Violence:No   Subjective: Testing included the ADOS 2 Module 4, (2 hrs. for testing and scoring) along with the SRS-2 (0.25 hrs.), BRIEF 2A (0.25 hrs.), and CAARS-2 (0.25 hrs.).  Parents complete the SRS-2 online prior to the session but not the[place] 3.    Patient was cooperative and displayed good effort. Attention and concentration were adequate overall, although patient exhibited several  instances of self-correction, and asking questions to be repeated.  Patient needed an extensive break and worked slowly in general which led to longer than expected testing time.  Mood was euthymic with appropriate affect.  Patient initiated social interaction but those were related to personal interests. She was frequently responsive to social advances from the examiner with appropriate comments but did not ask any socially related questions.  The results appear representative of current functioning.    Total testing administration and scoring hours: 12/19/23 - 3 hours 12/25/23 - 2.75 hrs. Total 5.75 hrs.   Diagnosis:Autism spectrum disorder  Attention deficit hyperactivity disorder (ADHD), combined type  Generalized anxiety disorder  Plan: Testing complete pending return of [place] 3. Report writing to be conducted followed by interactive feedback next session.     Bryson Dames, PhD                  Bryson Dames, PhD

## 2023-12-27 ENCOUNTER — Encounter: Payer: Self-pay | Admitting: Psychology

## 2023-12-27 NOTE — Progress Notes (Signed)
Tiffany Graves is a 18 y.o. female patient Report writing competed ( 3.5 hrs.).  Interactive feedback to be conducted next session. Report to be attached to the feedback progress note.  Patient/Guardian was advised Release of Information must be obtained prior to any record release in order to collaborate their care with an outside provider. Patient/Guardian was advised if they have not already done so to contact the registration department to sign all necessary forms in order for [place] to release information regarding their care.   Consent: Patient/Guardian gives verbal consent for treatment and assignment of benefits for services provided during this visit. Patient/Guardian expressed understanding and agreed to proceed.    Bryson Dames, PhD

## 2024-01-17 ENCOUNTER — Ambulatory Visit: Payer: BC Managed Care – PPO | Admitting: Psychology

## 2024-02-05 ENCOUNTER — Ambulatory Visit (INDEPENDENT_AMBULATORY_CARE_PROVIDER_SITE_OTHER): Payer: BC Managed Care – PPO | Admitting: Behavioral Health

## 2024-02-05 ENCOUNTER — Encounter: Payer: Self-pay | Admitting: Behavioral Health

## 2024-02-05 DIAGNOSIS — F411 Generalized anxiety disorder: Secondary | ICD-10-CM

## 2024-02-05 DIAGNOSIS — F330 Major depressive disorder, recurrent, mild: Secondary | ICD-10-CM

## 2024-02-05 DIAGNOSIS — F840 Autistic disorder: Secondary | ICD-10-CM

## 2024-02-05 NOTE — Progress Notes (Addendum)
 [facility] Counselor/Therapist Progress Note  Patient ID: Tiffany Graves, MRN: [id number],    Date: 02/05/2024  Time Spent: 3 PM until 3:58 PM, 58 minutes spent in person with the patient in the outpatient therapist office.  Treatment Type: Individual Therapy  Reported Symptoms: Anxiety, depression  Mental Status Exam: Appearance:  Well Groomed     Behavior: Appropriate  Motor: Normal  Speech/Language:  Clear and Coherent  Affect: Appropriate  Mood: normal  Thought process: normal  Thought content:   WNL  Sensory/Perceptual disturbances:   WNL  Orientation: oriented to person, place, time/date, situation, day of week, and month of year  Attention: Good  Concentration: Good  Memory: WNL  Fund of knowledge:  Good  Insight:   Good  Judgment:  Good  Impulse Control: Good   Risk Assessment: Danger to Self:  No Self-injurious Behavior: No Danger to Others: No Duty to Warn:no Physical Aggression / Violence:No  Access to Firearms a concern: No  Gang Involvement:No   Subjective: The patient reports that not much has happened since our last session 6 weeks ago.  She has completed testing with Dr. Altabet and will be meeting with him in a few weeks to find out the results.  The mother indicated that she needs documentation so that she can figure out the next up to take with the patient such as vocational rehabilitation.  The patient is doing chores around the house now and her father is paying her.  She said that until she was sick last week she done pretty good with that for 2 weeks but knows that her father is watching closely.  She typically is in no hurry to go to sleep and so she sleeps until late morning which is now the expectation.  She is expected to be up at 11:00 and have the chores done before her father gets home.  She realizes that she is going to have to do something to stay busy in addition to that and that is a part of the mother's concern.  The mother says  that she and the patient's father are older and they want to make sure that she is established in some sort of program and/or vocation forward I will meet with the patient every 6 weeks but will speak with her psychologist after they meet with him.  Manage the patient's anxiety especially socially.  She indicates there is some anxiety in going places she gets here and speaks to people he gets better.   She does contract for safety having no thoughts of hurting herself or anyone else.  Interventions: Cognitive Behavioral Therapy  Diagnosis: Generalized anxiety disorder, major depressive disorder, recurrent, mild  Plan: I will meet with the patient every 2 to 3 weeks in person.  Treatment plan: We will use cognitive behavioral therapy as well as elements of dialectical behavior therapy and person centered therapy with a goal of reducing the patient's anxiety and depression and a target date of December 07, 2023.  Goals for anxiety or to improve her ability to better handle stress, identify causes for anxiety and explore ways to reduce it, resolve core conflicts that are contributing to her anxiety and panic as well as to help her manage worrisome thoughts and thinking contributing to anxiety/panic/fear.  Interventions will include providing education about anxiety to help her understand its causes, symptoms and triggers.  We will facilitate problem solution skills to help her implement options for reducing stress, teach coping skills to reduce anxiety such  as grounding exercises, progressive muscle relaxation.  We will use cognitive behavioral therapy to identify and change anxiety producing thoughts and behavior patterns as well as use dialectical behavior therapy to teach mindfulness and distress tolerance skills.   Goals for reducing depression include the patient having less sadness as indicated by PHQ-9 report and patient report, to help her have an improved mood and return to a healthier level of  functioning, identify causes for depressed mood and learn ways to cope with depression.  Interventions include exploring how the patient experiences depression in a day to day setting, using cognitive behavioral therapy to explore and replace unhealthy thoughts and behavior patterns contributing to depression.  We will provide education about depression to help her identify its causes symptoms and triggers, encouraged sharing of feelings related to her depression and its causes and symptoms.  We will also teach and encouraged the consistent use of  coping skills for managing depressive symptoms.  We will also use CBT to identify and change his depression provoking thought and behavior patterns as well as teach distress tolerance skills. Progress: 30% we reviewed the treatment goals with the patient and we will continue the goals as stated above with a new target date of June 05, 2024 Lorrene CHRISTELLA Hasten, [facility]                   Lorrene CHRISTELLA Hasten, [facility]               Lorrene CHRISTELLA Hasten, [facility]               Lorrene CHRISTELLA Hasten, [facility]               Lorrene CHRISTELLA Hasten, [facility]               Lorrene CHRISTELLA Hasten, [facility]               Lorrene CHRISTELLA Hasten, [facility]               Lorrene CHRISTELLA Hasten, [facility]

## 2024-02-05 NOTE — Assessment & Plan Note (Signed)
Adderall XR 20 mg daily.

## 2024-02-05 NOTE — Assessment & Plan Note (Signed)
 Flonase daily prn

## 2024-02-07 ENCOUNTER — Encounter: Payer: Self-pay | Admitting: Family Medicine

## 2024-02-07 ENCOUNTER — Other Ambulatory Visit (HOSPITAL_BASED_OUTPATIENT_CLINIC_OR_DEPARTMENT_OTHER): Payer: Self-pay

## 2024-02-07 ENCOUNTER — Ambulatory Visit: Payer: BC Managed Care – PPO | Admitting: Family Medicine

## 2024-02-07 VITALS — BP 106/64 | HR 100 | Temp 98.5°F | Resp 20 | Ht 67.0 in | Wt 105.8 lb

## 2024-02-07 DIAGNOSIS — J302 Other seasonal allergic rhinitis: Secondary | ICD-10-CM

## 2024-02-07 DIAGNOSIS — J309 Allergic rhinitis, unspecified: Secondary | ICD-10-CM | POA: Diagnosis not present

## 2024-02-07 DIAGNOSIS — F909 Attention-deficit hyperactivity disorder, unspecified type: Secondary | ICD-10-CM | POA: Diagnosis not present

## 2024-02-07 MED ORDER — COVID-19 MRNA VAC-TRIS(PFIZER) 30 MCG/0.3ML IM SUSY
0.3000 mL | PREFILLED_SYRINGE | Freq: Once | INTRAMUSCULAR | 0 refills | Status: AC
  Filled 2024-02-07: qty 0.3, 1d supply, fill #0

## 2024-02-07 MED ORDER — FLUTICASONE PROPIONATE 50 MCG/ACT NA SUSP: 2.0000 | Freq: Every day | NASAL | 6 refills | Status: AC

## 2024-02-07 MED ORDER — CETIRIZINE HCL 10 MG PO TABS: 10.0000 mg | ORAL_TABLET | Freq: Every day | ORAL | 5 refills | Status: AC | PRN

## 2024-02-07 NOTE — Patient Instructions (Addendum)
 Allergies, Adult An allergy is a condition that causes the body's defense system (immune system) to react too strongly to an allergen. An allergen is a substance that is harmless to most people but can cause a reaction in some people. Allergies often affect the nose (allergic rhinitis), eyes (conjunctivitis), skin (atopic dermatitis), and stomach. They can be mild, moderate, or severe. They cannot spread from person to person. Allergies can start at any age. In some cases, they may go away as you get older. What are the causes? Allergies are caused by allergens. These may be: Outdoor allergens. These include pollen, car fumes, and mold. Indoor allergens. These include dust, smoke, mold, and pet dander. Other allergens. These include foods, medicines, scents, and insect bites or stings. What increases the risk? You are more likely to have allergies if you have: Family members with allergies. Family members who have a condition that may be caused by allergens, such as asthma. What are the signs or symptoms? Symptoms depend on how severe your allergy is. Mild to moderate symptoms Runny nose, stuffy nose (nasal congestion), or sneezing. Itchy mouth, ears, or throat. Postnasal drip. This is a feeling of mucus dripping down the back of your throat. Sore throat. Itchy, red, watery, or puffy eyes. Skin rash, or itchy, red, swollen areas of skin (hives). Stomach cramps or bloating. Severe symptoms A bad allergy to food, medicine, or insect bites may cause a severe reaction (anaphylactic reaction). Symptoms include: A red face. Coughing or making high-pitched whistling sounds when you breathe, most often when you breathe out (wheezing). Swollen lips, tongue, or mouth. A tight or swollen throat. Chest pain or tightness, or a fast heartbeat. Trouble breathing or shortness of breath. Pain in your abdomen. Vomiting or diarrhea. Feeling dizzy or fainting. How is this diagnosed? Allergies are  diagnosed based on your symptoms, your family and medical history, and a physical exam. You may also have tests, such as: Skin tests. These may be done to see how your skin reacts to allergens. Tests include: Skin prick test. For this test, the allergen is put in your body through a small prick in the skin. Intradermal skin test. For this test, a small amount of the allergen is put under the first layer of your skin. Patch test. For this test, a small amount of the allergen is placed on your skin. The area is covered and then checked after a few days. Blood tests. A challenge test. For this test, you eat or breathe in the allergen to see if you have a reaction. You may also be asked to: Keep a food diary. This means writing down all the foods, drinks, and symptoms you have in a day. Try an elimination diet. To do this: Stop eating certain foods. Add those foods back one by one to find out if any of them cause a reaction. How is this treated?     Treatment for allergies depends on your symptoms. It may include: Cold, wet cloths (cold compresses). These can be used to soothe itching and swelling. Eye drops or nasal sprays. A saline solution to clear out your nose and keep it moist (nasal irrigation). A saline solution is made of salt and water. A humidifier. This can add moisture to the air. Skin creams. These can treat rashes or itching. Diet changes to cut out foods that cause allergies. Being exposed again and again to tiny amounts of allergens. This can help your body build a defense against them (tolerance). This process  is called immunotherapy. It may be done using: Allergy shots. This is when you get a shot of the allergen. Sublingual immunotherapy. This is when you take a small dose of the allergen under your tongue. Allergy medicines (antihistamines) or other medicines. These can help block the allergic reaction. Using an auto-injector pen. An auto-injector pen is a device filled  with medicine that gives an emergency shot of epinephrine. Your health care provider will teach you how to use it. Follow these instructions at home: Medicines  Take or apply over-the-counter and prescription medicines only as told by your provider. Always carry your auto-injector pen if you are at risk of an anaphylactic reaction. Give yourself the shot as told by your provider. Eating and drinking Follow instructions from your provider about what you may eat and drink. Drink enough fluid to keep your pee (urine) pale yellow. General instructions Wear a medical alert bracelet or necklace if you have had an anaphylactic reaction in the past. Avoid known allergens when you can. Keep all follow-up visits. Your provider will watch your symptoms and talk about treatment options with you. Contact a health care provider if: Your symptoms do not get better with treatment. Get help right away if: You have any symptoms of anaphylactic reaction. You use an auto-injector pen. You will need more medical care even if the medicine seems to be working. An anaphylactic reaction may happen again within 72 hours (rebound anaphylaxis). These symptoms may be an emergency. Use the auto-injector pen right away. Then call 911. Do not wait to see if the symptoms will go away. Do not drive yourself to the hospital. This information is not intended to replace advice given to you by your health care provider. Make sure you discuss any questions you have with your health care provider. Document Revised: 07/05/2022 Document Reviewed: 07/05/2022 Elsevier Patient Education  2024 [employer]Tinnitus Tinnitus is when you hear a sound that there's no actual source for. It may sound like ringing in your ears or something else. The sound may be loud, soft, or somewhere in between. It can last for a few seconds or be constant for days. It can come and go. Almost everyone has tinnitus at some point. It's not the same as  hearing loss. But you may need to see a health care provider if: It lasts for a long time. It comes back often. You have trouble sleeping and focusing. What are the causes? The cause of tinnitus is often unknown. In some cases, you may get it if: You're around loud noises, such as from machines or music. An object gets stuck in your ear. Earwax builds up in Landscape architect. You drink a lot of alcohol or caffeine. You take certain medicines. You start to lose your hearing. You may also get it from some medical conditions. These may include: Ear or sinus infections. Heart diseases. High blood pressure. Allergies. Mnire's disease. Problems with your thyroid. A tumor. This is a growth of cells that isn't normal. A weak, bulging blood vessel called an aneurysm near your ear. What increases the risk? You may be more likely to get tinnitus if: You're around loud noises a lot. You're older. You drink alcohol. You smoke. What are the signs or symptoms? The main symptom is hearing a sound that there's no source for. It may sound like ringing. It may also sound like: Buzzing. Sizzling. Blowing air. Hissing. Whistling. Other sounds may include: Roaring. Running water. A musical note. Tapping. Humming. You may have  symptoms in one ear or both ears. How is this diagnosed? Tinnitus is diagnosed based on your symptoms, your medical history, and an exam. Your provider may do a full hearing test if your tinnitus: Is in just one ear. Makes it hard for you to hear. Lasts 6 months or longer. You may also need to see an expert in hearing disorders called an audiologist. They may ask you about your symptoms and how tinnitus affects your daily life. You may have other tests done. These may include: A CT scan. An MRI. An angiogram. This shows how blood flows through your blood vessels. How is this treated? Treatment may include: Therapy to help you manage the stress of living with  tinnitus. Finding ways to mask or cover the sound of tinnitus. These include: Sound or white noise machines. Devices that fit in your ear and play sounds or music. A loud humidifier. Acoustic neural stimulation. This is when you use headphones to listen to music that has a special signal in it. Over time, this signal may change some of the pathways in your brain. This can make you less sensitive to tinnitus. This treatment is used for very severe cases. Using hearing aids or cochlear implants if your tinnitus is from hearing loss. If your tinnitus is caused by a medical condition, treating the condition may make it go away.  Follow these instructions at home: Managing symptoms     Try to avoid being in loud places or around loud noises. Wear earplugs or headphones when you're around loud noises. Find ways to reduce stress. These may include meditation, yoga, or deep breathing. Sleep with your head slightly raised. General instructions Take over-the-counter and prescription medicines only as told by your provider. Track the things that cause symptoms (triggers). Try to avoid these things. To stop your tinnitus from getting worse: Do not drink alcohol. Do not have caffeine. Do not use any products that contain nicotine or tobacco. These products include cigarettes, chewing tobacco, and vaping devices, such as e-cigarettes. If you need help quitting, ask your provider. Avoid using too much salt. Get enough sleep each night. Where to find more information American Tinnitus Association: https://www.johnson-hamilton.org/ Contact a health care provider if: Your symptoms last for 3 weeks or longer without stopping. You have sudden hearing loss. Your symptoms get worse or don't get better with home care. You can't manage the stress of living with tinnitus. Get help right away if: You get tinnitus after a head injury. You have tinnitus and: Dizziness. Nausea and vomiting. Loss of balance. A sudden, severe  headache. Changes to your eyesight. Weakness in your face, arms, or legs. These symptoms may be an emergency. Get help right away. Call 911. Do not wait to see if the symptoms will go away. Do not drive yourself to the hospital. This information is not intended to replace advice given to you by your health care provider. Make sure you discuss any questions you have with your health care provider. Document Revised: 01/29/2023 Document Reviewed: 01/29/2023 Elsevier Patient Education  2024 [employer].

## 2024-02-08 NOTE — Progress Notes (Signed)
 Subjective:    Patient ID: Tiffany Graves, female    DOB: Mar 23, 2006, 18 y.o.   MRN: [id number]  Chief Complaint  Patient presents with   Follow-up    HPI Discussed the use of AI scribe software for clinical note transcription with the patient, who gave verbal consent to proceed.  History of Present Illness Lesle Reek Graves is a 18 year old female who presents with ear pain and tinnitus following an electrical shock from headphones.  She experiences ear pain and tinnitus after using headphones that delivered an electrical shock. The tinnitus is described as a 'deafening high-pitched tone,' particularly noticeable at night. The symptoms have persisted since the incident, with no relief found. The left ear is more bothersome, with pain and itching. She attempted to alleviate the itching by poking at it with her nail, which may have worsened the condition. No discharge or significant redness is noted, but discomfort persists.  In addition to ear issues, she experiences headaches attributed to allergies. These headaches are described as pressure-like and are exacerbated by head movements, such as tilting or bending over. Advil has not provided relief, but allergy medication like Zyrtec has offered some improvement. No nausea, vomiting, or vision changes are associated with the headaches.  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 She has a history of anxiety and is currently undergoing therapy and testing to address her mental health concerns.    Past Medical History:  Diagnosis Date   ADD (attention deficit disorder) 10/01/2012   Loss of weight 01/25/2016   WCC (well child check) 10/01/2012    History reviewed. No pertinent surgical history.  Family History  Problem Relation Age of Onset   Hypertension Mother    Hyperlipidemia Mother    Diabetes Father        type 2   Cancer Paternal  Grandmother        breast- remission   Diabetes Paternal Grandmother        type 2   Stroke Paternal Grandfather     Social History   Socioeconomic History   Marital status: Single    Spouse name: Not on file   Number of children: Not on file   Years of education: Not on file   Highest education level: Not on file  Occupational History   Not on file  Tobacco Use   Smoking status: Never   Smokeless tobacco: Never  Substance and Sexual Activity   Alcohol use: No   Drug use: No   Sexual activity: Never  Other Topics Concern   Not on file  Social History Narrative   ** Merged History Encounter **       12 Grade at [employer]   Social Drivers of Health   Financial Resource Strain: Not on file  Food Insecurity: Not on file  Transportation Needs: Not on file  Physical Activity: Not on file  Stress: Not on file  Social Connections: Not on file  Intimate Partner Violence: Not on file    Outpatient Medications Prior to Visit  Medication Sig Dispense Refill   amphetamine-dextroamphetamine (ADDERALL XR) 20 MG 24 hr capsule Take 1 capsule (20 mg total) by mouth daily. November 2023 30 capsule 0   amphetamine-dextroamphetamine (ADDERALL XR) 20 MG 24 hr capsule Take 1 capsule (20 mg total) by mouth every morning. October 2023 30 capsule 0   fluticasone (FLONASE) 50 MCG/ACT nasal  spray Place 2 sprays into both nostrils daily. 16 g 6   No facility-administered medications prior to visit.    No Known Allergies  Review of Systems  Constitutional:  Negative for fever and malaise/fatigue.  HENT:  Positive for congestion, ear pain and tinnitus.   Eyes:  Negative for blurred vision and pain.  Respiratory:  Negative for shortness of breath.   Cardiovascular:  Negative for chest pain, palpitations and leg swelling.  Gastrointestinal:  Negative for abdominal pain, blood in stool and nausea.  Genitourinary:  Negative for dysuria and frequency.  Musculoskeletal:  Negative for  falls.  Skin:  Negative for rash.  Neurological:  Positive for headaches. Negative for dizziness and loss of consciousness.  Endo/Heme/Allergies:  Negative for environmental allergies.  Psychiatric/Behavioral:  Positive for depression. The patient is nervous/anxious.        Objective:    Physical Exam Constitutional:      General: She is not in acute distress.    Appearance: Normal appearance. She is well-developed. She is not toxic-appearing.  HENT:     Head: Normocephalic and atraumatic.     Right Ear: External ear normal.     Left Ear: External ear normal.     Nose: Nose normal.  Eyes:     General:        Right eye: No discharge.        Left eye: No discharge.     Conjunctiva/sclera: Conjunctivae normal.  Neck:     Thyroid: No thyromegaly.  Cardiovascular:     Rate and Rhythm: Normal rate and regular rhythm.     Heart sounds: Normal heart sounds. No murmur heard. Pulmonary:     Effort: Pulmonary effort is normal. No respiratory distress.     Breath sounds: Normal breath sounds.  Abdominal:     General: Bowel sounds are normal.     Palpations: Abdomen is soft.     Tenderness: There is no abdominal tenderness. There is no guarding.  Musculoskeletal:        General: Normal range of motion.     Cervical back: Neck supple.  Lymphadenopathy:     Cervical: No cervical adenopathy.  Skin:    General: Skin is warm and dry.  Neurological:     Mental Status: She is alert and oriented to person, place, and time.  Psychiatric:        Mood and Affect: Mood normal.        Behavior: Behavior normal.        Thought Content: Thought content normal.        Judgment: Judgment normal.   BP 106/64 (BP Location: Left Arm, Patient Position: Sitting, Cuff Size: Normal)   Pulse 100   Temp 98.5 F (36.9 C) (Oral)   Resp 20   Ht 5\' 7"  (1.702 m)   Wt 105 lb 12.8 oz (48 kg)   SpO2 98%   BMI 16.57 kg/m  Wt Readings from Last 3 Encounters:  02/07/24 105 lb 12.8 oz (48 kg) (9%, Z=  -1.35)*  10/09/23 102 lb 3.2 oz (46.4 kg) (5%, Z= -1.62)*  03/15/23 104 lb (47.2 kg) (8%, Z= -1.42)*   * Growth percentiles are based on CDC (Girls, 2-20 Years) data.    Diabetic Foot Exam - Simple   No data filed    Lab Results  Component Value Date   TSH 1.21 01/27/2022    Lab Results  Component Value Date   TSH 1.21 01/27/2022   No results found  for: "WBC", "HGB", "HCT", "MCV", "PLT" No results found for: "NA", "K", "CHLORIDE", "CO2", "GLUCOSE", "BUN", "CREATININE", "BILITOT", "ALKPHOS", "AST", "ALT", "PROT", "ALBUMIN", "CALCIUM", "ANIONGAP", "EGFR", "GFR" No results found for: "CHOL" No results found for: "HDL" No results found for: "LDLCALC" No results found for: "TRIG" No results found for: "CHOLHDL" No results found for: "HGBA1C"     Assessment & Plan:  Attention deficit hyperactivity disorder (ADHD), unspecified ADHD type Assessment & Plan: Adderall XR 20 mg daily   Allergic rhinitis, unspecified seasonality, unspecified trigger Assessment & Plan: Flonase daily prn   Seasonal allergies -     Fluticasone Propionate; Place 2 sprays into both nostrils daily.  Dispense: 16 g; Refill: 6  Other orders -     Cetirizine HCl; Take 1 tablet (10 mg total) by mouth daily as needed for allergies.  Dispense: 30 tablet; Refill: 5    Assessment and Plan Assessment & Plan Ear pain and tinnitus Ear pain and tinnitus following electrical shock sensation from headphones. Suspected nerve irritation, possibly related to otitis externa, but no infection or inflammation observed. - Recommend multivitamin and fish oil supplementation for nerve healing. - Advise hydrogen peroxide spray after shower if itching occurs.  Allergic rhinitis Congestion, headaches, and ear pressure likely due to allergies. Eustachian tubes may be blocked from swollen mucus linings. Allergy medications have provided some relief. - Prescribe Flonase nasal spray and Zyrtec (cetirizine) for allergy  management. - Advise nasal saline spray twice daily, separate from Flonase use. - Educated on consistent use of allergy medications for several weeks due to high pollen season. - Send prescriptions for Flonase and Zyrtec to [facility] on [address].  Attention deficit hyperactivity disorder (ADHD) Undergoing therapy and testing for ADHD. Discussion about potential continuation of Adderall based on upcoming test results. - Await results from Dr. Lesia Hausen testing on April 24th. - Consider resuming Adderall based on test results and patient preference. - Plan for potential virtual visit to discuss medication options post-testing.  Anxiety Moderate anxiety being evaluated through therapy and testing. Therapy and potential medication management discussed based on test results. - Continue therapy sessions. - Evaluate anxiety management strategies post-testing results.  Recurrent knee swelling Intermittent knee swelling suspected to be fluid collection due to repetitive motion or friction, similar to a ganglion cyst. - Advise ice application if swelling occurs.     Danise Edge, MD

## 2024-02-18 ENCOUNTER — Other Ambulatory Visit (HOSPITAL_BASED_OUTPATIENT_CLINIC_OR_DEPARTMENT_OTHER): Payer: Self-pay

## 2024-02-28 ENCOUNTER — Encounter: Payer: Self-pay | Admitting: Psychology

## 2024-02-28 ENCOUNTER — Ambulatory Visit: Admitting: Psychology

## 2024-02-28 DIAGNOSIS — F902 Attention-deficit hyperactivity disorder, combined type: Secondary | ICD-10-CM | POA: Diagnosis not present

## 2024-02-28 DIAGNOSIS — F840 Autistic disorder: Secondary | ICD-10-CM | POA: Diagnosis not present

## 2024-02-28 DIAGNOSIS — F8181 Disorder of written expression: Secondary | ICD-10-CM

## 2024-02-28 DIAGNOSIS — F411 Generalized anxiety disorder: Secondary | ICD-10-CM

## 2024-02-28 NOTE — Progress Notes (Signed)
   Tiffany Dames, PhD

## 2024-02-28 NOTE — Progress Notes (Signed)
 Psychological Testing Report - Confidential   Psychiatric/Psychological Consult Reply: The limits of confidentiality were discussed with [facility]. Tiffany Graves verbally indicated her assent and her understanding and agreement with these limits based on her signature on the Limits of Confidentiality Statement.  Purpose of Evaluation: Tiffany Graves was a right-handed 18 year old Black female. The purpose of the evaluation is to provide diagnostic information and treatment recommendations.  Relevant Background Information: Tiffany Graves was self-referred to [facility] for psychological testing to evaluate for previously diagnosed neurodevelopmental disorders and gain an update of current functioning.  Tiffany Graves was previously evaluated by this provider in 2017.  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  Prenatal history was reported to be high risk due to advanced maternal age, but pregnancy was reported to be typical. Tiffany Graves was delivered via C-section due to the fetus turning face down in the womb prior to delivery. Developmental history was reported to be typical for early milestones. Regarding current development, gross motor skills were reported to be adequate, but Tiffany Graves is not fluid with coordinated movement, especially while driving.  She has trouble with motor planning and must do movements incrementally.  She also reported having fine motor difficulty, including trouble with handwriting.  She struggled with fastening buttons until 7th grade.  She indicated having adequately developed other fine motor skills, but her hands will shake while driving. Regarding speech, Tiffany Graves reported "talking in circles" with some stammering or stuttering when anxious.  She can explain herself well when calm, according to teachers.  Self-care was reported to be adequate other than remembering to brush teeth, wash her face,  and make herself eat.  She has trouble styling her hair but can only wash and condition it.  Tiffany Graves struggles with being independent.  She asks mother for help with most aspects of independent living and prefers when navigational issues are explained to her.  Socially, Tiffany Graves reported having strong long term friendships, but she doesn't take the initiative to contact.    Medical history was reported to be significant for being underweight but not an at-risk level.  Tiffany Graves was reported to be bothered by certain food textures and is often too consumed watching videos to bother eating. Allergy to mold was reported while a history of head injury, seizures, allergies, and digestive issues was denied. Tiffany Graves is prescribed Adderall for attention deficits, but she is not currently taking this medication as she is not in school or working. Illegal drug use was denied, and current health was reported to be good.  Previous psychological history is significant for Attention Deficit Hyperactivity Disorder (ADHD), anxiety, and Autism Spectrum Disorder (ASD).  Tiffany Graves doesn't participate in ongoing psychotherapy but was   hospitalized briefly after bringing a kitchen knife to school during 2017. 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  Educationally, Tiffany Graves earned her high school diploma but has not attended college. college. She is not working and is supported financially by her parents.  She has not held a previous job.  Tiffany Graves stated that she would like to have a job, either with stocking at a retail store or a working at a day care center. She has put in multiple applications but has not had any interviews.  Current recreation interest/hobbies include crocheting, making soap, painting, audio books, and watching You Tube videos.       Tiffany Graves currently lives with her  parents in an intact family unit.  She has two siblings (ages 20 and 64 years) who were both academically gifted and currently live outside of the home.  Tiffany Graves's niece and nephew are currently living with family.  Her niece has severe Autism, which causes stress for [facility].  Tiffany Graves reported having good relations with her mother.  Adequate relations were reported with her father, although he argues with her more.  Tiffany Graves identified as a bisexual female but is currently dating anyone and has a limited dating history.  She reported being closest to her mother, although her friends seem more supportive.  Family mental health history was significant for anxiety on both the maternal and paternal side, along with her niece having ASD.  A history of trauma or abuse was denied.  Current stressors include financial difficulties and family conflict with her niece and nephew.  Strengths consist of drawing and describing scenery in her mind.   Barriers to success include not knowing how to do any adult navigational activities.  Tiffany Graves indicated being overly dependent on others for transportation, filling out applications, and creating a resume'.         Presenting Symptomology: Tiffany Graves reported having trouble falling asleep easily.  She had been going to bed at 6 am or sunrise due to staying on her phone with a friend who was working night shift.  She has inconsistent with sleep patterns even when not talking to them.  Recent changes in appetite were denied but her appetite is consistently low, and she is underweight.  Her energy is inconsistent during the day due to poor sleep and forgetting to eat.  Episodes of prolonged sadness or depressed mood were denied.  She becomes anxious during the holiday season but has not exhibited any anxiety or panic within the past few months.  Specific worries or fears were denied.  However, general worry was reported along with becoming anxious during public speaking or doing activities alone.  She will socialize in  groups but not participate in [place].  Occasional intrusive thought was reported while compulsive behavior was denied other than excessive checking.  Tiffany Graves reported having trouble paying attention and becoming easily distracted with frequent losing and forgetting.  She has poor organization.  Frequent restlessness/fidgeting was reported, along with verbal impulsivity and some impulsive behavior.  Tiffany Graves stated that she gets along adequately with peers but doesn't like too many of them.  She believes that she understands jokes and sarcasm but is not sure.  She can read nonverbal cues most times but does not know how to respond to them.  Tiffany Graves reported having some close long-term friendships but not initiating contact with them.  Repetitive speech or behavior was denied, although she indicated having some overly intense interests.  She can cope adequately with change and transition provided the change is logical to her.  Sensory hypersensitivity was denied, but she'd becomes anxious by hearing paper crinkling.    Procedures Administered: [place] Almond Intelligence Test - 2 CNS Vital Signs Christiana Cower - IV Test of Achievement Behavior Rating Inventory for Executive Function - Adult Self-Report   Adult OCD Inventory - Self Report Depression, Anxiety, and Stress Scale - Self Report Autism Diagnostic Observation Schedule (ADOS 2)- Module 4 Social Responsiveness Scale -2 (Self and Other report)  Procedural Considerations: Psychological testing measures were administered in a standard manner. Tiffany Graves was seen in person and observed throughout the entire administration.  Behavioral Observations: Tiffany Graves was cooperative and displayed good effort. Attention and concentration were  adequate overall, although Giavanna exhibited several instances of self-correction and asking questions to be repeated.  She needed an extensive break and worked slowly in general which led to longer than expected testing time.   Mood was euthymic with appropriate affect.  Angelynn initiated social interaction related solely to personal interests. She was frequently responsive to social advances from the examiner with appropriate comments but did not ask any socially related questions.  The results appear representative of current functioning. Mental status examination yielded no noticeable difficulty with orientation or judgment, with fair insight. Initial attention and concentration appeared adequate as was memory. She denied auditory hallucinations, delusions, and dangerous ideation.     Test Results and Interpretation: General Intellectual Functioning:  The K-BIT 2 was used to assess Eryn's performance across two areas of cognitive ability. When interpreting these scores, it is important to view the results as a snapshot of current intellectual functioning. As measured by the K-BIT 2, Tiffany Graves's Composite IQ score fell within the average range when compared to same age peers (CIQ = 108).  Tiffany Graves's performance was inconsistent across the Primary Index Scores, as Verbal Comprehension (VCI = 112) was high average and higher than Perceptual Reasoning (PRI = 101, average).  The difference was statistically significant at the P <.05 level.  This indicates strong language understanding with typical visual learning ability.  On individual subtests, Shamaria performed within the age typical range for verbal knowledge and well-developed inferential thinking (riddles), indicating a strength with verbal abstract reasoning.  Visual pattern analysis (matrices) was average.  Overall, Tiffany Graves appears to have adequately developed reasoning skills.  Alberta Almond Brief Intelligence Test - 2 Composite Score Summary  Composite Scores  Sum of Raw Scores Standard Score Percentile Rank 90% Confidence Interval Qualitative Description  Verbal Comprehension  VC  89        112  79  106-117  High Average  Nonverbal Reasoning PR 37       101 53  96-106 Average  Composite IQ  FSIQ -       108 70 104-112 Average    K-BIT 2 Continued Domain Subtest Name  Total Raw Score Scaled Score Percentile Rank  Verbal Verbal Knowledge VK 50  11 63  Comprehension Riddles Ri 43  13 84   Adaptive Behavior & Independent Skills: The [place]-3 is a standardized measure of adaptive behavior--the things that people do to function in their everyday lives. Whereas ability measures focus on what the examinee can do in a testing situation, the [place]-3 focuses on what they actually do in daily life. Because it is a norm-based instrument, the examinee's adaptive functioning is compared to that of others their age. Tiffany Graves was evaluated using the [employer] Form on 12/19/2023. Tiffany Graves, [place] mother, completed the form. Tiffany Graves's overall level of adaptive functioning is described by her score on the Adaptive Behavior Composite (ABC). Her ABC score is 64, which is well below the normative mean of 100 (the normative standard deviation is 15). The percentile rank for this overall score is 1.   The ABC score is based on scores for three specific adaptive behavior domains: Communication, Daily Living Skills, and Socialization. The domain scores are also expressed as standard scores with a mean of 100 and standard deviation of 15. The Communication domain measures how well Jadamarie listens and understands, expresses herself through speech, and reads and writes. Her Communication standard score is 66. This corresponds to a percentile rank of 1. This domain is a  relative strength for [facility]. The Daily Living Skills domain assesses Emaleigh's performance of the practical, everyday tasks of living that are appropriate for her age. Her standard score for Daily Living Skills is 61, which corresponds to a percentile rank of <1. Tiffany Graves's score for the Socialization domain reflects her functioning in social situations. Her  Socialization standard score is 58. The percentile rank is <1. This domain is a relative weakness for Rillie.  [place] Adaptive behavior Scales 3  ABC and Domain Score Summary  Subdomain Score Summary   The Maladaptive Behavior domain provides a brief assessment of problem behaviors. The additional information it provides can prove helpful in diagnosis or intervention planning. It may also be used as a screener to determine if a more in-depth assessment of problematic behavior is warranted.  The domain includes brief scales measuring Internalizing (i.e., emotional) and Externalizing (i.e., acting-out) problems. These scales are reported using v-scale scores, which are scaled to a mean of 15 and standard deviation of 3. Higher Internalizing and Externalizing v-scale scores indicate more problem behavior. If qualitative descriptors are desired, scores of 1 to 17 may be considered Average, 18 to 20 Elevated, and 21 to 24 Clinically Significant. Tiffany Graves received v-scale scores of 22 for Internalizing and 18 for Externalizing. The Maladaptive Behavior domain also includes a set of Critical Items covering more severe maladaptive behaviors. Because the Critical Items do not form a unified construct, they are not scored as a scale, but instead are reported at the item level. The Critical Items for which [facility] received a score of 2 (Often) or 1 (Sometimes) are listed on the next page.   Gets fixated on objects or parts of objects (Often) Loses awareness of what is happening around her (Often) Repeats physical movements over and over (Often) Gets so fixated on a topic that it annoys others (Often) Wanders or darts away without regard for safety (Sometimes)  Attention and Concentration: The results of the CNS Vital Signs testing indicated low overall neurocognitive processing ability, at a level well below measured comprehension ability (average).  Regarding areas related to attention problems, simple  attention was low average, while complex attention was low, with very low cognitive flexibility and executive function.  Sustained attention was a strength in the average range.  These are the domains most closely associated with attention deficits.  Psychomotor speed was low average, with low processing speed and very low reaction time, indicating slow thinking speed and coordinated movement on computerized measures, with much hesitancy in responding.  Visual memory was high average, with average verbal memory, indicating greater ability to remember images than words.  Working [employer] fast streamer (used for multi-tasking and problems solving) was average.  Recognizing emotional expression was age typical as the Social Acuity domain was average.  The results suggest that Lyniah appears to have adequate ability attending to simple activities but struggles much more with complex processing, including shifting attention and attending systematically.  Memory was strong while thinking speed and responsiveness were slow, which could result in slow performance or becoming overwhelmed or making excessive mistakes when rushed.  The validity scales indicated a valid profile for all measures.  CNS Vital Signs Domain Scores Standard Score Percentile Above Average Low Average Low Very Low  Neurocognition Index (NCI) 78 7    X   Composite Memory 106 66  X     Verbal Memory 98 45  X     Visual Memory 110 75 X      Psychomotor Speed 85 16   X    Reaction Time* 64 1     X  Complex Attention* 75 5    X   Cognitive Flexibility 62 1     X  Processing Speed 79 8    X   Executive Function 62 1     X  Social Acuity 103 58  X     Working Memory 105 63  X     Sustained Attention 106 66  X     Simple Attention 89 23   X    Motor Speed 96 40  X      Reading and Writing Skills: Testing for reading and writing skills using the [employer] - IV indicated age typical  reading ability (average range) relatively consistent with IQ (average), with significant impairment in overall writing (low range).  Strength was noted with reading comprehension (average) and reading recall (average) with significant deficits noted in spelling (very low) and writing fluency (low).  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 Woodcock-Johnson IV Tests of Achievement Form B and Extended (Norms based on age 33-10) CLUSTER/Test W GE SS PR  READING 522 9.5 93 32  BROAD READING 522 8.2 89 24  READING COMPREHENSION 517 13.0 100 49  WRITTEN LANGUAGE 499 4.9 74 4  BROAD WRITTEN LANGUAGE 499 4.8 72 3  WRITTEN EXPRESSION 506 6.6 86 18  Letter-Word Identification 518 7.7 88 22  Spelling 486 3.5 62 0.5  Passage Comprehension 525 12.9 98 45  Writing Samples 513 9.3 96 39  Sentence Reading Fluency 522 6.8 86 18  Sentence Writing Fluency 498 4.6 76 6  Reading Recall 508 >16.8 103 57   Executive Function: Trachelle completed the Self-Report Form of the Behavior Rating Inventory of Executive Function, Second Edition-Adult Version (BRIEF2A) on 12/25/2023. There are no missing item responses in the protocol. Responses are reasonably consistent. Allysa's ratings of herself do not appear overly negative. There were no atypical responses to infrequently endorsed items. In the context of these validity considerations, ratings of Tyrea's executive function exhibited in everyday behavior indicate some areas of concern. The overall index score, the GEC, was moderately elevated (GEC T = 72, %ile = 97).  Regulation Index (BRI) score was within normal limits (BRI T = 63, %ile = 89), but the Emotion Regulation Index (ERI) score was mildly elevated (ERI T = 65, %ile = 89) and the Cognitive Regulation Index (CRI) score was highly elevated (CRI T = 75, %ile = 99).  Within these summary indicators, all of  the individual scales can be calculated. One or more of the individual BRIEF2A scale T scores were elevated, suggesting that [facility] exhibits difficulty with some aspects of executive function. Concerns are noted with her ability to adjust well to changes, get going on tasks and activities and independently generate ideas, sustain working memory, plan and organize her approach to problem solving appropriately, be appropriately cautious in her approach to tasks and for mistakes and keep materials and belongings reasonably well-organized. Katira's ability to resist impulses, be aware of her functioning in  social settings, and react to events appropriately was not described as problematic.  Cobie's scores on the Working Memory, Plan/Organize, Initiate, and Task-Monitor scales are elevated. This profile suggests significant difficulties with general cognitive problem solving. Tiffany Graves is likely to have significant difficulty with independent problem solving due to problems with her ability to actively hold multiple pieces of information in mind and systematically construct a plan. As a result, this may have a negative impact on her attempts to get started on these tasks. In addition, Tiffany Graves is likely to have difficulty monitoring her performance during tasks.  Also,  Comfort's elevated scores on scales reflecting problems with fundamental behavioral and/or emotional regulation (Inhibit, Emotional Control, and Shift) suggest that more global problems with self-regulation are having a negative effect on active cognitive problem solving (elevated CRI).   Behavior Rating Inventory for Executive Function 2A - Adult Self Report Scale/Index/Composite Raw score T score Percentile 90% CI  Inhibit 15 62 88 54-70  Self-Monitor 11 63 89 55-71  Behavior Regulation Index (BRI) 26 63 89 57-69  Shift 14 70 98 63-77  Emotional Control 14 56 74 51-61  Emotion Regulation Index (ERI) 28 65 89 60-70  Initiate 19 69 97  62-76  Working Memory 21 77 >99 71-83  Plan/Organize 18 68 97 62-74  Task-Monitor 14 68 98 60-76  Organization of Materials 19 70 96 64-76  Cognitive Regulation Index (CRI) 91 75 99 71-79  Global Executive Composite (GEC) 145 72 97 69-75  Behavior and Social Emotional Functioning:  Self-report measures indicated mild overall emotional distress.  Sherisse's responses on the Adult OCD Inventory indicated a mild level of obsessive-compulsive tendencies, with her total score falling within the 'Mild' range.  Four of the 20 individual items were highly endorsed (occurring often or very often), including difficulty with decision making, arranging items symmetrically, repetitive thoughts/words, and excessive guilt.  Additionally, Shaira's scores on the Depression, Anxiety, and Stress Scale indicated subclinical scores for depression and stress with a mild to moderately high anxiety score.   Three of the 21 items were rated as occurring frequently including dry mouth, trembling hands, and increased heartbeat.  The results suggest noticeable physical anxiety symptoms without other significant difficulty.    Regarding symptoms of ASD, information from the ADOS 2 Module 4 indicated difficulty with several aspects of social communication and reciprocal social interaction, with two instances of restricted-repetitive behavior observed during this administration.  Her overall severity score indicated a moderate likelihood of ASD, although adults often mask atypical behavior during these observations.  Within the area of communication, Danicia spoke in complete sentences.  There was typical variation in her tone of voice while there was no observation of echolalia or repetitive speech.  She adequately reported routine and nonroutine events and frequently offered spontaneous personal information.  Delesha responded adequately to personal information from the examiner with related comments but did not ask any socially  related questions.  Reciprocal conversation appeared typically developed for her age and cognitive level.  She demonstrated limited use of descriptive gestures (only doing so when asked, with occasional emphatic but no emotional gestures.  Socially, Tiffany Graves could establish but not maintain eye contact.  Her range of facial expression seemed appropriate, and she frequently direct facial expression to the examiner.  Her expression of enjoyment in interaction appeared typical during this observation.  Ilaria exhibited difficulty elaborating on personal emotions as well as identifying emotions in others during pictures or other activities.  Caree demonstrated limited insight into the nature  of social relationships but could describe her own role in the few relationships she has.  Her engagement in adult independent activities seemed below age and IQ level.  Shania frequently initiated interaction but primarily related to the activities or her own interests.  She was frequently responsive to social advances from the examiner, but the responses lacked reciprocity.  Social rapport was adequately established as Jhania appeared relaxed and was adequately engaged.  Harman demonstrated literal object use and difficulty with storytelling.  Regarding behavior, Brownie did not demonstrate any sensory seeking behavior, odd movement, or self-injury.  Compulsive behavior was observed in the form of lining up objects during a break period.  Additionally, Ajane occasionally discussed her intense interest in watching ASMR videos.    Gladiola completed the Social Responsiveness Scale - 2 (SRS-2) regarding ASD related behavior.  On this measure, the T Score of 65 was in the Mild to Moderate range. Scores in this range indicate deficiencies in reciprocal social behavior that are clinically significant and may lead to mild to moderate interference with everyday social interactions.  The score for social communication fell within  the mild to moderate range while the restricted repetitive behavior score was moderately high.    Social Responsiveness Scale - 2 - Self Report                                Awr             Cog             Com           Mot            RRB Raw                         13                13                28              14               16                               T-score                    69                60                65              61               67  Awr = Social Awareness    Com = Social Museum/gallery exhibitions officer = Social Cognition     Mot = Social Motivation  RRB = Restricted Interests and Repetitive Behavior  DSM-5 Compatible Subscales Raw score T-score  Social Communication and Interaction          68   65    Restricted Interests and Repetitive Behavior         16   67      Teyah's mother Andrienne Havener also completed the Social Responsiveness Scale - 2 (SRS-2) regarding Samra's behavior.  On this measure, the  T Score of 73 was within the Moderate range. Scores in this range indicate deficiencies in reciprocal social behavior that are clinically significant and lead to substantial interference with everyday social interactions. Such scores are typical for individuals with autism spectrum disorders of moderate severity. The score for social communication was moderately elevated while the restricted repetitive behavior score was in the severely elevated range.    Social Responsiveness Scale - 2 - Other Report                                Awr             Cog             Com           Mot            RRB Raw score               12                21                24               25               24                               T-score                    66                74                61               79               80  Awr = Social Awareness    Com = Social Communication Cog = Social Cognition    Mot = Social Motivation  RRB = Restricted Interests and Repetitive  Behavior  DSM-5 Compatible Subscales Raw score T-score  Social Communication and Interaction          82   71    Restricted Interests and Repetitive Behavior         24   80   Summary: Jeanenne was evaluated during February 2025 to confirm diagnosis of a neurodevelopmental disorder along with updating current functioning.  Jora presents with a history of anxiety and depressed mood, along with impairment in executive function and navigational skills. This keeps her from obtaining her driver's license, attending college or finding a job. She was previously evaluated in 2017 by this evaluator and diagnosed with Autism spectrum disorder and ADHD at that time, but updated testing is needed for patient to access adult support services such as job and independent skills training. Testing recommended to update neurocognitive and social emotional functioning. Test results indicated average overall comprehension ability (K-BIT 2R), with better developed Verbal Comprehension than Nonverbal Reasoning, with strength in verbal abstract reasoning.  Ratings of adaptive behavior, however, indicated very low independent skills in the areas of communication, self-help, and socialization with several maladaptive behaviors.  Testing for neurocognitive processing indicated poorly developed overall processing with low or very low functioning regarding attention for complex activities, cognitive flexibility, executive function, processing  speed, and responsiveness.  Ratings for execution function indicated severely impaired functioning overall with severely impaired shifting attention and cognitive regulation in general.  Self-report ratings for emotional functioning indicated mild to moderate current emotional distress, with only a mild OCD symptoms and moderate physical anxiety.  Depression and current stress were not rated as problematic.  Direct testing for ASD indicated much difficulty with reciprocal social interaction and  nonverbal communication, with two instances of restricted repetitive behavior observed during testing.  Self and other report ratings both indicated impaired functioning regarding mild to moderate social communication deficits with moderate to severe restricted-repetitive behavior (RRB).   The results meet the criteria for ASD due to noticeable social communication impairment and restricted-repetitive behavior across settings.  Criterion for ADHD and a Specific Learning disability in Writing continue to be met as well, based on direct testing, ratings, and developmental history.  Anxiety seems significant as well with significant physical symptoms, general worry, and social/performance anxiety.  See below for recommendations.        Recommendations: Referral back to primary care. Consider with medication for attention and concentration when returning to work or school, along with medication to regulate anxiety, especially if stimulant medication is used for attention.  Cognitive performance and attention can also be increased by increasing physical conditioning through adequate sleep, good nutrition and exercise. Individual counseling is recommended to help [facility] with developing emotion regulation and social interaction skills, along with addressing any anxiety or depressed mood related to ASD and ADHD. Charly would benefit from a structured therapy approach that focuses on the teaching of emotional identification and expression, social interaction skills, and perspective taking. It is also recommended that [facility] family receive training in Positive Behavior Support techniques to help them set up [facility] environment for success and learn strategies to help promote calming and independence at home. Meet with college Office of Disability Supports should Alycea enroll in post-secondary courses to address support services and accommodations at school. It is also recommended that [facility] receive extra  time to complete tests and assignments, assistance with note taking (visual guides), allowance of oral versus written explanations (e.g. showing work on math problems), and being able to type or dictate written work. Socially, Chriss would benefit from participation in structured social activity like clubs or interest groups that are small and are supervised along with having clear rules, guidelines and activities. It is recommended that Maysoon's family help her in structure her home and potential study environments. Developing visual supports and a system for generating and accessing reminders will help with keeping Abrea on task with less prompting by others. Listening skills can be enhanced by asking the speaker to give information in small chunks and asking for explanation and clarification.    Milon Aloe Gershon Shorten, Ph.D. Licensed Psychologist- HSP-P [phone]               Rual Vermeer, PhD

## 2024-02-28 NOTE — Progress Notes (Signed)
 [facility] Counselor/Therapist Progress Note  Patient ID: Tiffany Graves, MRN: [id number],    Date: 02/28/2024  Time Spent: 2:00 - 3:00 pm   Treatment Type: Testing - Feedback Session  Met with patient and mother to review results of testing.  Patient and mother were at the clinic and session was conducted from therapist's office in person.  Reported Symptoms: Patients presents with a history of anxiety and depressed mood, along with impairment in executive function and navigational skills. This keeps her from obtaining her driver's license, attending college or finding a job. She was previously evaluated in 2017 by this evaluator and diagnosed with Autism spectrum disorder and ADHD at that time, but updated testing is needed for patient to access adult support services such as job and independent skills training. Testing recommended to update neurocognitive and social emotional functioning.    Subjective: Interactive feedback was conducted (1 hr.).  It was discussed how patient met the criterion for Autism Spectrum Disorder and ADHD along with how these conditions affect her ability to engage in independent behavior and relate to others.  Recommendations included discussing results with PCP, developing a visual organization system, and seeking individual counseling along with accessing appropriate vocational and/or educational supports.  Patient and mother expressed agreement with the results and recommendations.     Total Time of Virtual Evaluation Services: 4 hrs. 02/28/24 - Interactive Feedback:1 hr. 12/27/23 - Report Writing: 3 hrs.   Diagnosis:Autism spectrum disorder - Level 1  Attention deficit hyperactivity disorder (ADHD), combined type  Generalized Anxiety Disorder  Specific Learning Disorder - Written Expression  Plan: Report given to patient and will be sent  to referring provider.     Dene Nazir, PhD

## 2024-03-18 ENCOUNTER — Ambulatory Visit (INDEPENDENT_AMBULATORY_CARE_PROVIDER_SITE_OTHER): Admitting: Behavioral Health

## 2024-03-18 ENCOUNTER — Encounter: Payer: Self-pay | Admitting: Behavioral Health

## 2024-03-18 DIAGNOSIS — F330 Major depressive disorder, recurrent, mild: Secondary | ICD-10-CM

## 2024-03-18 DIAGNOSIS — F411 Generalized anxiety disorder: Secondary | ICD-10-CM | POA: Diagnosis not present

## 2024-03-18 DIAGNOSIS — F840 Autistic disorder: Secondary | ICD-10-CM

## 2024-03-18 NOTE — Progress Notes (Addendum)
 [facility] Counselor/Therapist Progress Note  Patient ID: Tiffany Graves, MRN: [id number],    Date: 03/18/2024  Time Spent: 10 AM until 10:58 AM, 58 minutes spent in person with the patient in the outpatient therapist office.  Treatment Type: Individual Therapy  Reported Symptoms: Anxiety, depression  Mental Status Exam: Appearance:  Well Groomed     Behavior: Appropriate  Motor: Normal  Speech/Language:  Clear and Coherent  Affect: Appropriate  Mood: normal  Thought process: normal  Thought content:   WNL  Sensory/Perceptual disturbances:   WNL  Orientation: oriented to person, place, time/date, situation, day of week, and month of year  Attention: Good  Concentration: Good  Memory: WNL  Fund of knowledge:  Good  Insight:   Good  Judgment:  Good  Impulse Control: Good   Risk Assessment: Danger to Self:  No Self-injurious Behavior: No Danger to Others: No Duty to Warn:no Physical Aggression / Violence:No  Access to Firearms a concern: No  Gang Involvement:No   Subjective:  The patient did complete testing which indicated that she is on the autism spectrum.  That only confirmed what she already believed.  Testing is in the chart and he did indicate that there were things that the patient is slow to process.  She recognizes that sometimes her emotional recognition and expression cues are somewhat delayed but she says she can usually figure it out in context.  We looked at some different exercises for her to have better emotional recognition and expression.  One of the recommendations was for the patient to be more social such as joining a Chiropodist.  We looked at some different ways she could look at social activities especially connected to her love for arts ongoing painting etc.  We also talked about work related that she could do.  She is having some frustration with the application process especially with [employer] but she is going to talk to her sister about  helping her get that straightened out.  suggested that she reach out to vocational rehabilitation explaining to her what that was.  She said she and her mom would look into that. Per patient request we will meet monthly due to financial restrictions.  She does contract for safety having no thoughts of hurting herself or anyone else.  Interventions: Cognitive Behavioral Therapy  Diagnosis: Generalized anxiety disorder, major depressive disorder, recurrent, mild  Plan: I will meet with the patient every 2 to 3 weeks in person.  Treatment plan: We will use cognitive behavioral therapy as well as elements of dialectical behavior therapy and person centered therapy with a goal of reducing the patient's anxiety and depression and a target date of December 07, 2023.  Goals for anxiety or to improve her ability to better handle stress, identify causes for anxiety and explore ways to reduce it, resolve core conflicts that are contributing to her anxiety and panic as well as to help her manage worrisome thoughts and thinking contributing to anxiety/panic/fear.  Interventions will include providing education about anxiety to help her understand its causes, symptoms and triggers.  We will facilitate problem solution skills to help her implement options for reducing stress, teach coping skills to reduce anxiety such as grounding exercises, progressive muscle relaxation.  We will use cognitive behavioral therapy to identify and change anxiety producing thoughts and behavior patterns as well as use dialectical behavior therapy to teach mindfulness and distress tolerance skills.   Goals for reducing depression include the patient having less sadness as indicated  by PHQ-9 report and patient report, to help her have an improved mood and return to a healthier level of functioning, identify causes for depressed mood and learn ways to cope with depression.  Interventions include exploring how the patient experiences  depression in a day to day setting, using cognitive behavioral therapy to explore and replace unhealthy thoughts and behavior patterns contributing to depression.  We will provide education about depression to help her identify its causes symptoms and triggers, encouraged sharing of feelings related to her depression and its causes and symptoms.  We will also teach and encouraged the consistent use of  coping skills for managing depressive symptoms.  We will also use CBT to identify and change his depression provoking thought and behavior patterns as well as teach distress tolerance skills. Progress: 30% we reviewed the treatment goals with the patient and we will continue the goals as stated above with a new target date of June 05, 2024 Lorrene CHRISTELLA Hasten, [facility]

## 2024-04-22 ENCOUNTER — Encounter: Payer: Self-pay | Admitting: Behavioral Health

## 2024-04-22 ENCOUNTER — Ambulatory Visit (INDEPENDENT_AMBULATORY_CARE_PROVIDER_SITE_OTHER): Admitting: Behavioral Health

## 2024-04-22 DIAGNOSIS — F330 Major depressive disorder, recurrent, mild: Secondary | ICD-10-CM | POA: Diagnosis not present

## 2024-04-22 DIAGNOSIS — F411 Generalized anxiety disorder: Secondary | ICD-10-CM

## 2024-04-22 DIAGNOSIS — F840 Autistic disorder: Secondary | ICD-10-CM

## 2024-04-22 NOTE — Progress Notes (Addendum)
 [facility] Counselor/Therapist Progress Note  Patient ID: Tiffany Graves, MRN: [id number],    Date: 04/22/2024  Time Spent: 11 AM until 11:58 AM, 58 minutes spent in person with the patient in the outpatient therapist office.  Treatment Type: Individual Therapy  Reported Symptoms: Anxiety, depression  Mental Status Exam: Appearance:  Well Groomed     Behavior: Appropriate  Motor: Normal  Speech/Language:  Clear and Coherent  Affect: Appropriate  Mood: normal  Thought process: normal  Thought content:   WNL  Sensory/Perceptual disturbances:   WNL  Orientation: oriented to person, place, time/date, situation, day of week, and month of year  Attention: Good  Concentration: Good  Memory: WNL  Fund of knowledge:  Good  Insight:   Good  Judgment:  Good  Impulse Control: Good   Risk Assessment: Danger to Self:  No Self-injurious Behavior: No Danger to Others: No Duty to Warn:no Physical Aggression / Violence:No  Access to Firearms a concern: No  Gang Involvement:No   Subjective: The patient reports minimal anxiety over the past couple of months but also indicated there had not been much going on.  Over the past few weeks she has been staying up most of the night playing a design type of game which she says enhances her creativity  but she loses track of time so she is up a good part of the night and sleeping later in the day which is frustrating her mother.  We talked about some sleep modification so that she could get to bed earlier and sleep no more than through mid morning.  Mother also wants her to do more around the house so encouraged her to ask for one chore per day to do in addition to the work that she is doing outside in the yard for the family.  Her father is frustrated with her not working so I gave her the contact information for vocational rehabilitation explaining to her the services that they offered and encouraging her to call before our next  session.  She does contract for safety having no thoughts of hurting herself or anyone else.  Interventions: Cognitive Behavioral Therapy  Diagnosis: Generalized anxiety disorder, major depressive disorder, recurrent, mild  Plan: I will meet with the patient every 2 to 3 weeks in person.  Treatment plan: We will use cognitive behavioral therapy as well as elements of dialectical behavior therapy and person centered therapy with a goal of reducing the patient's anxiety and depression and a target date of December 07, 2023.  Goals for anxiety or to improve her ability to better handle stress, identify causes for anxiety and explore ways to reduce it, resolve core conflicts that are contributing to her anxiety and panic as well as to help her manage worrisome thoughts and thinking contributing to anxiety/panic/fear.  Interventions will include providing education about anxiety to help her understand its causes, symptoms and triggers.  We will facilitate problem solution skills to help her implement options for reducing stress, teach coping skills to reduce anxiety such as grounding exercises, progressive muscle relaxation.  We will use cognitive behavioral therapy to identify and change anxiety producing thoughts and behavior patterns as well as use dialectical behavior therapy to teach mindfulness and distress tolerance skills.   Goals for reducing depression include the patient having less sadness as indicated by PHQ-9 report and patient report, to help her have an improved mood and return to a healthier level of functioning, identify causes for depressed mood and learn ways to  cope with depression.  Interventions include exploring how the patient experiences depression in a day to day setting, using cognitive behavioral therapy to explore and replace unhealthy thoughts and behavior patterns contributing to depression.  We will provide education about depression to help her identify its causes symptoms and  triggers, encouraged sharing of feelings related to her depression and its causes and symptoms.  We will also teach and encouraged the consistent use of  coping skills for managing depressive symptoms.  We will also use CBT to identify and change his depression provoking thought and behavior patterns as well as teach distress tolerance skills. Progress: 30% we reviewed the treatment goals with the patient and we will continue the goals as stated above with a new target date of June 05, 2024 Lorrene CHRISTELLA Hasten, [facility]                      Lorrene CHRISTELLA Hasten, [facility]                  Lorrene CHRISTELLA Hasten, [facility]               Lorrene CHRISTELLA Hasten, [facility]               Lorrene CHRISTELLA Hasten, [facility]               Lorrene CHRISTELLA Hasten, [facility]               Lorrene CHRISTELLA Hasten, [facility]               Lorrene CHRISTELLA Hasten, [facility]               Lorrene CHRISTELLA Hasten, [facility]               Lorrene CHRISTELLA Hasten, [facility]

## 2024-05-13 ENCOUNTER — Ambulatory Visit (INDEPENDENT_AMBULATORY_CARE_PROVIDER_SITE_OTHER): Admitting: Family Medicine

## 2024-05-13 ENCOUNTER — Encounter: Payer: Self-pay | Admitting: Family Medicine

## 2024-05-13 VITALS — BP 91/59 | HR 75 | Ht 67.0 in | Wt 106.0 lb

## 2024-05-13 DIAGNOSIS — Z79899 Other long term (current) drug therapy: Secondary | ICD-10-CM

## 2024-05-13 DIAGNOSIS — R590 Localized enlarged lymph nodes: Secondary | ICD-10-CM

## 2024-05-13 DIAGNOSIS — F909 Attention-deficit hyperactivity disorder, unspecified type: Secondary | ICD-10-CM

## 2024-05-13 LAB — COMPREHENSIVE METABOLIC PANEL WITH GFR
ALT: 8 U/L (ref 0–35)
AST: 15 U/L (ref 0–37)
Albumin: 4.2 g/dL (ref 3.5–5.2)
Alkaline Phosphatase: 44 U/L (ref 39–117)
BUN: 10 mg/dL (ref 6–23)
CO2: 28 meq/L (ref 19–32)
Calcium: 9.3 mg/dL (ref 8.4–10.5)
Chloride: 107 meq/L (ref 96–112)
Creatinine, Ser: 0.97 mg/dL (ref 0.40–1.20)
GFR: 84.27 mL/min (ref >60.00)
Glucose, Bld: 85 mg/dL (ref 70–99)
Potassium: 4.1 meq/L (ref 3.5–5.1)
Sodium: 140 meq/L (ref 135–145)
Total Bilirubin: 0.4 mg/dL (ref 0.2–1.2)
Total Protein: 7.1 g/dL (ref 6.0–8.3)

## 2024-05-13 LAB — CBC WITH DIFFERENTIAL/PLATELET
Basophils Absolute: 0 K/uL (ref 0.0–0.1)
Basophils Relative: 0.9 % (ref 0.0–3.0)
Eosinophils Absolute: 0.2 K/uL (ref 0.0–0.7)
Eosinophils Relative: 4.7 % (ref 0.0–5.0)
HCT: 35.6 % — ABNORMAL LOW (ref 36.0–46.0)
Hemoglobin: 11.9 g/dL — ABNORMAL LOW (ref 12.0–15.0)
Lymphocytes Relative: 57.1 % — ABNORMAL HIGH (ref 12.0–46.0)
Lymphs Abs: 2 K/uL (ref 0.7–4.0)
MCHC: 33.6 g/dL (ref 30.0–36.0)
MCV: 92.2 fl (ref 78.0–100.0)
Monocytes Absolute: 0.4 K/uL (ref 0.1–1.0)
Monocytes Relative: 12.7 % — ABNORMAL HIGH (ref 3.0–12.0)
Neutro Abs: 0.9 K/uL — ABNORMAL LOW (ref 1.4–7.7)
Neutrophils Relative %: 24.6 % — ABNORMAL LOW (ref 43.0–77.0)
Platelets: 206 K/uL (ref 150.0–400.0)
RBC: 3.86 Mil/uL — ABNORMAL LOW (ref 3.87–5.11)
RDW: 14.3 % (ref 11.5–14.6)
WBC: 3.5 K/uL — ABNORMAL LOW (ref 4.5–10.5)

## 2024-05-13 NOTE — Progress Notes (Signed)
 Established Patient Office Visit  Subjective   Patient ID: Tiffany Graves, female    DOB: October 09, 2006  Age: 18 y.o. MRN: [id number]  Chief Complaint  Patient presents with   Medical Management of Chronic Issues    HPI    Discussed the use of AI scribe software for clinical note transcription with the patient, who gave verbal consent to proceed.  History of Present Illness Tiffany Graves is a 18 year old female who presents with a neck mass.  Approximately one month ago, she noticed a mass on her left side of neck that was initially only palpable but has since become visible. The mass has gradually increased in size over the past month and a half. She denies any pain associated with the mass and has not noticed any other similar masses elsewhere on her body.  About a week before noticing the neck mass, she experienced symptoms consistent with an upper respiratory infection, including coughing and 'hacking'. These symptoms persisted for about a week but have since resolved. She initially attributed these symptoms to allergies.  She frequently touches the mass and finds it difficult to keep her hands away from it. During the review of symptoms, she denies ear pain, sore throat, and runny nose.       ROS All review of systems negative except what is listed in the HPI    Objective:     BP (!) 91/59   Pulse 75   Ht 5' 7 (1.702 m)   Wt 106 lb (48.1 kg)   SpO2 100%   BMI 16.60 kg/m    Physical Exam Vitals reviewed.  Constitutional:      Appearance: Normal appearance.  HENT:     Head: Normocephalic and atraumatic.     Right Ear: Tympanic membrane normal.     Left Ear: Tympanic membrane normal.     Nose: Nose normal.     Mouth/Throat:     Mouth: Mucous membranes are moist.     Pharynx: Oropharynx is clear.  Neck:      Comments: Mobile node, not matted, no erythema or heat Cardiovascular:     Rate and Rhythm: Normal rate and regular rhythm.  Pulmonary:     Effort:  Pulmonary effort is normal.     Breath sounds: Normal breath sounds.  Musculoskeletal:     Cervical back: Normal range of motion and neck supple. No tenderness.  Lymphadenopathy:     Cervical: Cervical adenopathy present.     Right cervical: No superficial, deep or posterior cervical adenopathy. Skin:    General: Skin is warm and dry.  Neurological:     Mental Status: She is alert and oriented to person, place, and time.  Psychiatric:        Mood and Affect: Mood normal.        Behavior: Behavior normal.        Thought Content: Thought content normal.        Judgment: Judgment normal.      No results found for any visits on 05/13/24.    The ASCVD Risk score (Arnett DK, et al., 2019) failed to calculate for the following reasons:   The 2019 ASCVD risk score is only valid for ages 72 to 58    Assessment & Plan:   Problem List Items Addressed This Visit   None Visit Diagnoses       Lymphadenopathy, cervical    -  Primary   Relevant Orders   CBC with Differential/Platelet  Comprehensive metabolic panel with GFR   US  SOFT TISSUE HEAD & NECK (NON-THYROID )       Assessment & Plan Cervical Lymphadenopathy Palpable neck mass likely reactive lymphadenopathy, but persistent size (>1 month) requires further evaluation. - Order blood work today - Order neck ultrasound to evaluate lymph node. - Advised to avoid touching the mass excessively - Instructed to return if new symptoms develop     Return for - pending results or sooner if needed.    Tiffany Graves Mon, NP

## 2024-05-14 ENCOUNTER — Ambulatory Visit (HOSPITAL_BASED_OUTPATIENT_CLINIC_OR_DEPARTMENT_OTHER)
Admission: RE | Admit: 2024-05-14 | Discharge: 2024-05-14 | Disposition: A | Discharge status: Home / Self Care | Source: Ambulatory Visit | Attending: Family Medicine | Admitting: Family Medicine

## 2024-05-14 ENCOUNTER — Other Ambulatory Visit

## 2024-05-14 ENCOUNTER — Ambulatory Visit: Payer: Self-pay | Admitting: Family Medicine

## 2024-05-14 DIAGNOSIS — R590 Localized enlarged lymph nodes: Secondary | ICD-10-CM | POA: Diagnosis not present

## 2024-05-14 DIAGNOSIS — R7989 Other specified abnormal findings of blood chemistry: Secondary | ICD-10-CM | POA: Diagnosis not present

## 2024-05-15 LAB — CMV ABS, IGG+IGM (CYTOMEGALOVIRUS)
CMV IgM: <30 [AU]/ml
Cytomegalovirus Ab-IgG: <0.6 U/mL

## 2024-05-15 LAB — HIV ANTIBODY (ROUTINE TESTING W REFLEX): HIV 1&2 Ab, 4th Generation: NONREACTIVE

## 2024-05-15 LAB — EPSTEIN-BARR VIRUS VCA ANTIBODY PANEL
EBV NA IgG: <18 U/mL
EBV VCA IgG: <18 U/mL
EBV VCA IgM: <36 U/mL

## 2024-05-16 ENCOUNTER — Ambulatory Visit: Payer: Self-pay | Admitting: Family Medicine

## 2024-05-16 DIAGNOSIS — R590 Localized enlarged lymph nodes: Secondary | ICD-10-CM

## 2024-05-16 DIAGNOSIS — R7989 Other specified abnormal findings of blood chemistry: Secondary | ICD-10-CM

## 2024-05-20 ENCOUNTER — Other Ambulatory Visit

## 2024-05-20 DIAGNOSIS — R7989 Other specified abnormal findings of blood chemistry: Secondary | ICD-10-CM | POA: Insufficient documentation

## 2024-05-20 NOTE — Assessment & Plan Note (Signed)
 Repeat CBC

## 2024-05-20 NOTE — Progress Notes (Unsigned)
 Subjective:     Patient ID: Tiffany Graves, female    DOB: 09/22/2006, 18 y.o.   MRN: [id number]  No chief complaint on file.   HPI Patient presents follow-up neck mask. Seen last week by a Waddell Mon, NP. PMHx- Allergies. Hx neck mass for over 1 month, prior to this had experienced symptoms consistent with URI.  Those symptoms resolved but still had concerns about left-sided neck mass.   US -05/14/2024- WNL    She frequently touches the mass and finds it difficult to keep her hands away from it. During the review of symptoms, she denies ear pain, sore throat, and runny nose.   Patient denies fever, chills, SOB, CP, palpitations, dyspnea, edema, HA, vision changes, N/V/D, abdominal pain, urinary symptoms, rash, weight changes, and recent illness or hospitalizations.   History of Present Illness              Health Maintenance Due  Topic Date Due   Hepatitis C Screening  Never done    Past Medical History:  Diagnosis Date   ADD (attention deficit disorder) 10/01/2012   Loss of weight 01/25/2016   WCC (well child check) 10/01/2012    No past surgical history on file.  Family History  Problem Relation Age of Onset   Hypertension Mother    Hyperlipidemia Mother    Diabetes Father        type 2   Cancer Paternal Grandmother        breast- remission   Diabetes Paternal Grandmother        type 2   Stroke Paternal Grandfather     Social History   Socioeconomic History   Marital status: Single    Spouse name: Not on file   Number of children: Not on file   Years of education: Not on file   Highest education level: Not on file  Occupational History   Not on file  Tobacco Use   Smoking status: Never   Smokeless tobacco: Never  Substance and Sexual Activity   Alcohol use: No   Drug use: No   Sexual activity: Never  Other Topics Concern   Not on file  Social History Narrative   ** Merged History Encounter **       12 Grade at [employer]   Social  Drivers of Health   Financial Resource Strain: Not on file  Food Insecurity: Not on file  Transportation Needs: Not on file  Physical Activity: Not on file  Stress: Not on file  Social Connections: Not on file  Intimate Partner Violence: Not on file    Outpatient Medications Prior to Visit  Medication Sig Dispense Refill   cetirizine  (ZYRTEC ) 10 MG tablet Take 1 tablet (10 mg total) by mouth daily as needed for allergies. 30 tablet 5   fluticasone  (FLONASE ) 50 MCG/ACT nasal spray Place 2 sprays into both nostrils daily. 16 g 6   No facility-administered medications prior to visit.    No Known Allergies  ROS    See HPI Objective:    Physical Exam  General: No acute distress. Awake and conversant.  Eyes: Normal conjunctiva, anicteric. Round symmetric pupils.  ENT: Hearing grossly intact. No nasal discharge.  Neck: Neck is supple. No masses or thyromegaly.  Respiratory: CTAB. Respirations are non-labored. No wheezing.  Skin: Warm. No rashes or ulcers.  Psych: Alert and oriented. Cooperative, Appropriate mood and affect, Normal judgment.  CV: RRR. No murmur. No lower extremity edema.  MSK: Normal ambulation.  No clubbing or cyanosis.  Neuro:  CN II-XII grossly normal.    There were no vitals taken for this visit. Wt Readings from Last 3 Encounters:  05/13/24 106 lb (48.1 kg)  02/07/24 105 lb 12.8 oz (48 kg) (9%, Z= -1.35)*  10/09/23 102 lb 3.2 oz (46.4 kg) (5%, Z= -1.62)*   * Growth percentiles are based on CDC (Girls, 2-20 Years) data.       Assessment & Plan:   Problem List Items Addressed This Visit     Abnormal CBC - Primary   Repeat CBC.       L neck mass US  WNL, do not touch excessively.   Allergies Continue Zyrtec  10 mg daily    I am having Tiffany Graves maintain her fluticasone  and cetirizine .  No orders of the defined types were placed in this encounter.

## 2024-05-22 ENCOUNTER — Ambulatory Visit: Payer: Self-pay | Admitting: Family Medicine

## 2024-05-22 ENCOUNTER — Ambulatory Visit (INDEPENDENT_AMBULATORY_CARE_PROVIDER_SITE_OTHER): Admitting: Student

## 2024-05-22 ENCOUNTER — Encounter: Payer: Self-pay | Admitting: Student

## 2024-05-22 VITALS — BP 98/80 | HR 74 | Temp 98.1°F | Ht 67.0 in | Wt 107.0 lb

## 2024-05-22 DIAGNOSIS — R590 Localized enlarged lymph nodes: Secondary | ICD-10-CM | POA: Diagnosis not present

## 2024-05-22 DIAGNOSIS — R7989 Other specified abnormal findings of blood chemistry: Secondary | ICD-10-CM | POA: Diagnosis not present

## 2024-05-22 LAB — CBC WITH DIFFERENTIAL/PLATELET
Absolute Lymphocytes: 2568 {cells}/uL (ref 850–3900)
Absolute Monocytes: 554 {cells}/uL (ref 200–950)
Basophils Absolute: 39 {cells}/uL (ref 0–200)
Basophils Relative: 0.8 %
Eosinophils Absolute: 147 {cells}/uL (ref 15–500)
Eosinophils Relative: 3 %
HCT: 38 % (ref 35.0–45.0)
Hemoglobin: 12.1 g/dL (ref 11.7–15.5)
MCH: 30.5 pg (ref 27.0–33.0)
MCHC: 31.8 g/dL — ABNORMAL LOW (ref 32.0–36.0)
MCV: 95.7 fL (ref 80.0–100.0)
MPV: 12.3 fL (ref 7.5–12.5)
Monocytes Relative: 11.3 %
Neutro Abs: 1593 {cells}/uL (ref 1500–7800)
Neutrophils Relative %: 32.5 %
Platelets: 247 Thousand/uL (ref 140–400)
RBC: 3.97 Million/uL (ref 3.80–5.10)
RDW: 13.1 % (ref 11.0–15.0)
Total Lymphocyte: 52.4 %
WBC: 4.9 Thousand/uL (ref 3.8–10.8)

## 2024-05-22 NOTE — Assessment & Plan Note (Signed)
 Stable, Decrease in size since last week.  Likely due to viral etiology.  Advised patient to not excessively touch lymph node as she was doing this throughout exam.  Ultrasound- benign.

## 2024-06-10 ENCOUNTER — Telehealth: Admitting: Family Medicine

## 2024-06-24 ENCOUNTER — Ambulatory Visit: Admitting: Behavioral Health

## 2024-07-09 ENCOUNTER — Ambulatory Visit: Admitting: Behavioral Health

## 2024-07-22 ENCOUNTER — Ambulatory Visit (INDEPENDENT_AMBULATORY_CARE_PROVIDER_SITE_OTHER): Admitting: Behavioral Health

## 2024-07-22 ENCOUNTER — Encounter: Payer: Self-pay | Admitting: Behavioral Health

## 2024-07-22 DIAGNOSIS — F411 Generalized anxiety disorder: Secondary | ICD-10-CM | POA: Diagnosis not present

## 2024-07-22 DIAGNOSIS — F330 Major depressive disorder, recurrent, mild: Secondary | ICD-10-CM | POA: Diagnosis not present

## 2024-07-22 NOTE — Progress Notes (Signed)
 [facility] Counselor/Therapist Progress Note  Patient ID: Tiffany Graves, MRN: [id number],    Date: 07/22/2024  Time Spent: 10 AM until 10:55 AM, 55 minutes spent in person with the patient in the outpatient therapist office.  Treatment Type: Individual Therapy  Reported Symptoms: Anxiety, depression  Mental Status Exam: Appearance:  Well Groomed     Behavior: Appropriate  Motor: Normal  Speech/Language:  Clear and Coherent  Affect: Appropriate  Mood: normal  Thought process: normal  Thought content:   WNL  Sensory/Perceptual disturbances:   WNL  Orientation: oriented to person, place, time/date, situation, day of week, and month of year  Attention: Good  Concentration: Good  Memory: WNL  Fund of knowledge:  Good  Insight:   Good  Judgment:  Good  Impulse Control: Good   Risk Assessment: Danger to Self:  No Self-injurious Behavior: No Danger to Others: No Duty to Warn:no Physical Aggression / Violence:No  Access to Firearms a concern: No  Gang Involvement:No   Subjective: I had not seen the patient in several months because of insurance issues.  Her visits were paid for under her father's insurance but he is now retired and does not have that insurance.  She will not be able to continue with therapy but she is aware that if insurance situations change or she starts working and has insurance she is welcome to come back.  She reports there has been some adjustment issues with her father being around the house all the time but for the most part that is going well.  She is helping out around the house is much as she can.  It was a little window of time where she was not a child looking because of a combination of factors 1 being medical which has resolved itself and the other because she lost her phone.  She has started applying again which I encouraged her to be assertive with.  She has not reached out to vocational rehabilitation but I encouraged her to reach  back out to them for the application process.  She still helps watch her niece and nephew which she enjoys doing.  She is getting out more with some friends socially which she is enjoying.  She did renew her driver's permit with the intention of getting her license after she gets enough hours then.  Reports that her anxiety has been minimal and she feels it is manageable with coping skills and mindfulness.  She does contract for safety having no thoughts of hurting herself or anyone else.  Interventions: Cognitive Behavioral Therapy  Diagnosis: Generalized anxiety disorder, major depressive disorder, recurrent, mild  Plan: I will meet with the patient every 2 to 3 weeks in person.  Treatment plan: We will use cognitive behavioral therapy as well as elements of dialectical behavior therapy and person centered therapy with a goal of reducing the patient's anxiety and depression and a target date of December 07, 2023.  Goals for anxiety or to improve her ability to better handle stress, identify causes for anxiety and explore ways to reduce it, resolve core conflicts that are contributing to her anxiety and panic as well as to help her manage worrisome thoughts and thinking contributing to anxiety/panic/fear.  Interventions will include providing education about anxiety to help her understand its causes, symptoms and triggers.  We will facilitate problem solution skills to help her implement options for reducing stress, teach coping skills to reduce anxiety such as grounding exercises, progressive muscle relaxation.  We will  use cognitive behavioral therapy to identify and change anxiety producing thoughts and behavior patterns as well as use dialectical behavior therapy to teach mindfulness and distress tolerance skills.   Goals for reducing depression include the patient having less sadness as indicated by PHQ-9 report and patient report, to help her have an improved mood and return to a healthier level of  functioning, identify causes for depressed mood and learn ways to cope with depression.  Interventions include exploring how the patient experiences depression in a day to day setting, using cognitive behavioral therapy to explore and replace unhealthy thoughts and behavior patterns contributing to depression.  We will provide education about depression to help her identify its causes symptoms and triggers, encouraged sharing of feelings related to her depression and its causes and symptoms.  We will also teach and encouraged the consistent use of  coping skills for managing depressive symptoms.  We will also use CBT to identify and change his depression provoking thought and behavior patterns as well as teach distress tolerance skills. Progress: 40% we reviewed the treatment goals with the patient and we will continue the goals as stated above with a new target date of December 06, 2024 if the patient does return to therapy. Lorrene CHRISTELLA Hasten, [facility]                      Lorrene CHRISTELLA Hasten, [facility]                  Lorrene CHRISTELLA Hasten, [facility]               Lorrene CHRISTELLA Hasten, [facility]               Lorrene CHRISTELLA Hasten, [facility]               Lorrene CHRISTELLA Hasten, [facility]               Lorrene CHRISTELLA Hasten, [facility]               Lorrene CHRISTELLA Hasten, [facility]               Lorrene CHRISTELLA Hasten, [facility]               Lorrene CHRISTELLA Hasten, [facility]               Lorrene CHRISTELLA Hasten, [facility]

## 2024-08-21 ENCOUNTER — Ambulatory Visit: Admitting: Student

## 2024-09-04 ENCOUNTER — Telehealth: Admitting: Family Medicine

## 2024-12-22 ENCOUNTER — Encounter: Admitting: Family Medicine
